# Patient Record
Sex: Female | Born: 1960 | Race: White | Hispanic: No | Marital: Married | Smoking: Never smoker
Health system: Southern US, Community
[De-identification: ages and names within clinical notes are randomized; demographics above are authoritative.]

## PROBLEM LIST (undated history)

## (undated) DIAGNOSIS — E079 Disorder of thyroid, unspecified: Secondary | ICD-10-CM

## (undated) DIAGNOSIS — L709 Acne, unspecified: Secondary | ICD-10-CM

## (undated) DIAGNOSIS — F419 Anxiety disorder, unspecified: Secondary | ICD-10-CM

## (undated) HISTORY — DX: Anxiety disorder, unspecified: F41.9

## (undated) HISTORY — DX: Acne, unspecified: L70.9

## (undated) HISTORY — PX: TUBAL LIGATION: SHX77

---

## 1998-09-19 ENCOUNTER — Other Ambulatory Visit: Admission: RE | Admit: 1998-09-19 | Discharge: 1998-09-19 | Payer: Self-pay | Admitting: Obstetrics and Gynecology

## 1999-11-06 ENCOUNTER — Other Ambulatory Visit: Admission: RE | Admit: 1999-11-06 | Discharge: 1999-11-06 | Payer: Self-pay | Admitting: Obstetrics and Gynecology

## 2000-11-28 ENCOUNTER — Other Ambulatory Visit: Admission: RE | Admit: 2000-11-28 | Discharge: 2000-11-28 | Payer: Self-pay | Admitting: Obstetrics and Gynecology

## 2003-03-09 ENCOUNTER — Other Ambulatory Visit: Admission: RE | Admit: 2003-03-09 | Discharge: 2003-03-09 | Payer: Self-pay | Admitting: Obstetrics and Gynecology

## 2004-03-14 ENCOUNTER — Other Ambulatory Visit: Admission: RE | Admit: 2004-03-14 | Discharge: 2004-03-14 | Payer: Self-pay | Admitting: Obstetrics and Gynecology

## 2004-11-14 ENCOUNTER — Ambulatory Visit: Payer: Self-pay | Admitting: Internal Medicine

## 2004-11-21 ENCOUNTER — Ambulatory Visit: Payer: Self-pay | Admitting: Internal Medicine

## 2005-05-28 ENCOUNTER — Other Ambulatory Visit: Admission: RE | Admit: 2005-05-28 | Discharge: 2005-05-28 | Payer: Self-pay | Admitting: Obstetrics and Gynecology

## 2009-11-08 ENCOUNTER — Encounter (INDEPENDENT_AMBULATORY_CARE_PROVIDER_SITE_OTHER): Payer: Self-pay | Admitting: *Deleted

## 2009-11-17 ENCOUNTER — Encounter (INDEPENDENT_AMBULATORY_CARE_PROVIDER_SITE_OTHER): Payer: Self-pay | Admitting: *Deleted

## 2009-11-27 ENCOUNTER — Encounter: Admission: RE | Admit: 2009-11-27 | Discharge: 2009-11-27 | Payer: Self-pay | Admitting: Obstetrics and Gynecology

## 2009-12-07 ENCOUNTER — Encounter (INDEPENDENT_AMBULATORY_CARE_PROVIDER_SITE_OTHER): Payer: Self-pay | Admitting: *Deleted

## 2009-12-07 ENCOUNTER — Ambulatory Visit: Payer: Self-pay | Admitting: Internal Medicine

## 2010-01-02 ENCOUNTER — Ambulatory Visit: Payer: Self-pay | Admitting: Internal Medicine

## 2010-01-04 ENCOUNTER — Encounter: Payer: Self-pay | Admitting: Internal Medicine

## 2010-06-21 ENCOUNTER — Encounter: Admission: RE | Admit: 2010-06-21 | Discharge: 2010-06-21 | Payer: Self-pay | Admitting: Obstetrics and Gynecology

## 2010-10-16 NOTE — Letter (Signed)
Summary: Previsit letter  [facility]  [address] [place], [place] [address]   Phone: [phone]  Fax: [phone]       11/17/2009 MRN: [id number]  [facility] [address] [place], [place]  [address]  Dear Jodi Mahoney,  Welcome to the Gastroenterology Division at [facility].    You are scheduled to see a nurse for your pre-procedure visit on 12-07-09 at 1pm on the 3rd floor at [facility], [address]  We ask that you try to arrive at our office 15 minutes prior to your appointment time to allow for check-in.  Your nurse visit will consist of discussing your medical and surgical history, your immediate family medical history, and your medications.    Please bring a complete list of all your medications or, if you prefer, bring the medication bottles and we will list them.  We will need to be aware of both prescribed and over the counter drugs.  We will need to know exact dosage information as well.  If you are on blood thinners (Coumadin, Plavix, Aggrenox, Ticlid, etc.) please call our office today/prior to your appointment, as we need to consult with your physician about holding your medication.   Please be prepared to read and sign documents such as consent forms, a financial agreement, and acknowledgement forms.  If necessary, and with your consent, a friend or relative is welcome to sit-in on the nurse visit with you.  Please bring your insurance card so that we may make a copy of it.  If your insurance requires a referral to see a specialist, please bring your referral form from your primary care physician.  No co-pay is required for this nurse visit.     If you cannot keep your appointment, please call [phone] to cancel or reschedule prior to your appointment date.  This allows [place] the opportunity to schedule an appointment for another patient in need of care.    Thank you for choosing [facility] for your medical  needs.  We appreciate the opportunity to care for you.  Please visit [place] at our website  to learn more about our practice.                     Sincerely.                                                                                                                   The Gastroenterology Division

## 2010-10-16 NOTE — Letter (Signed)
Summary: Colonoscopy Letter  [facility]  [address] [place], [place] [address]   Phone: [phone]  Fax: [phone]      November 08, 2009 MRN: [id number]   [facility] [address] [place], [place]  [address]   Dear Ms. Rea,   According to your medical record, it is time for you to schedule a Colonoscopy. The American Cancer Society recommends this procedure as a method to detect early colon cancer. Patients with a family history of colon cancer, or a personal history of colon polyps or inflammatory bowel disease are at increased risk.  This letter has beeen generated based on the recommendations made at the time of your procedure. If you feel that in your particular situation this may no longer apply, please contact our office.  Please call our office at [phone] to schedule this appointment or to update your records at your earliest convenience.  Thank you for cooperating with [place] to provide you with the very best care possible.   Sincerely,  Hedwig Morton. Juanda Chance, M.D.  [facility] Gastroenterology Division [phone]

## 2010-10-16 NOTE — Letter (Signed)
Summary: [facility] Instructions  [facility]  [address] [place], [place] [address]   Phone: [phone]  Fax: [phone]       Jodi Mahoney    [birth year]    MRN: [id number]        Procedure Day /Date:01/02/10  Tuesday     Arrival Time:  7:30am      Procedure Time: 8:30am     Location of Procedure:                    _x _  [facility] (4th Floor)  PREPARATION FOR COLONOSCOPY WITH MOVIPREP   Starting 5 days prior to your procedure _4/14/11 _ do not eat nuts, seeds, popcorn, corn, beans, peas,  salads, or any raw vegetables.  Do not take any fiber supplements (e.g. Metamucil, Citrucel, and Benefiber).  THE DAY BEFORE YOUR PROCEDURE         DATE:  12/27/09  DAY:  Monday  1.  Drink clear liquids the entire day-NO SOLID FOOD  2.  Do not drink anything colored red or purple.  Avoid juices with pulp.  No orange juice.  3.  Drink at least 64 oz. (8 glasses) of fluid/clear liquids during the day to prevent dehydration and help the prep work efficiently.  CLEAR LIQUIDS INCLUDE: Water Jello Ice Popsicles Tea (sugar ok, no milk/cream) Powdered fruit flavored drinks Coffee (sugar ok, no milk/cream) Gatorade Juice: apple, white grape, white cranberry  Lemonade Clear bullion, consomm, broth Carbonated beverages (any kind) Strained chicken noodle soup Hard Candy                             4.  In the morning, mix first dose of MoviPrep solution:    Empty 1 Pouch A and 1 Pouch B into the disposable container    Add lukewarm drinking water to the top line of the container. Mix to dissolve    Refrigerate (mixed solution should be used within 24 hrs)  5.  Begin drinking the prep at 5:00 p.m. The MoviPrep container is divided by 4 marks.   Every 15 minutes drink the solution down to the next mark (approximately 8 oz) until the full liter is complete.   6.  Follow completed prep with 16 oz of clear liquid of your choice (Nothing red or purple).   Continue to drink clear liquids until bedtime.  7.  Before going to bed, mix second dose of MoviPrep solution:    Empty 1 Pouch A and 1 Pouch B into the disposable container    Add lukewarm drinking water to the top line of the container. Mix to dissolve    Refrigerate  THE DAY OF YOUR PROCEDURE      DATE:   01/02/10 DAY:  Tuesday  Beginning at3:30am (5 hours before procedure):         1. Every 15 minutes, drink the solution down to the next mark (approx 8 oz) until the full liter is complete.  2. Follow completed prep with 16 oz. of clear liquid of your choice.    3. You may drink clear liquids until 6:30am   (2 HOURS BEFORE PROCEDURE).   MEDICATION INSTRUCTIONS  Unless otherwise instructed, you should take regular prescription medications with a small sip of water   as early as possible the morning of your procedure.           OTHER INSTRUCTIONS  You will  need a responsible adult at least 49 years of age to accompany you and drive you home.   This person must remain in the waiting room during your procedure.  Wear loose fitting clothing that is easily removed.  Leave jewelry and other valuables at home.  However, you may wish to bring a book to read or  an iPod/MP3 player to listen to music as you wait for your procedure to start.  Remove all body piercing jewelry and leave at home.  Total time from sign-in until discharge is approximately 2-3 hours.  You should go home directly after your procedure and rest.  You can resume normal activities the  day after your procedure.  The day of your procedure you should not:   Drive   Make legal decisions   Operate machinery   Drink alcohol   Return to work  You will receive specific instructions about eating, activities and medications before you leave.    The above instructions have been reviewed and explained to me by   Clide Cliff, RN_______________________    I fully understand and can verbalize  these instructions _____________________________ Date _________

## 2010-10-16 NOTE — Procedures (Signed)
Summary: Colonoscopy  Patient: Steve Gregg Note: All result statuses are Final unless otherwise noted.  Tests: (1) Colonoscopy (COL)   COL Colonoscopy           DONE     [facility]     [address]     [place], [place]  [address]           COLONOSCOPY PROCEDURE REPORT           PATIENT:  Jaela, Yepez  MR#:  [id number]     BIRTHDATE:  November 14, 1960, 48 yrs. old  GENDER:  female     ENDOSCOPIST:  Hedwig Morton. Juanda Chance, MD     REF. BY:  Creola Corn, M.D.     PROCEDURE DATE:  01/02/2010     PROCEDURE:  Colonoscopy 76[address]     ASA CLASS:  Class I     INDICATIONS:  father with colon cancer     hyperplastic polyp 11/2004     MEDICATIONS:   Versed 9 mg, Fentanyl 75 mcg           DESCRIPTION OF PROCEDURE:   After the risks benefits and     alternatives of the procedure were thoroughly explained, informed     consent was obtained.  Digital rectal exam was performed and     revealed no rectal masses.   The LB CF-[id number] endoscope     was introduced through the anus and advanced to the cecum, which     was identified by both the appendix and ileocecal valve, without     limitations.  The quality of the prep was good, using MiraLax.     The instrument was then slowly withdrawn as the colon was fully     examined.     <<PROCEDUREIMAGES>>           FINDINGS:  ULTRASONIC FINDINGS:  Two polyps were found (see image1     and image7).  mm polyp at 90 cm and 2 mm polyp at 20 cm, both     diminutive  Mild diverticulosis was found throughout the colon     (see image6 and image3).  This was otherwise a normal examination     of the colon (see image2, image4, image5, and image8).     Retroflexed views in the rectum revealed no abnormalities.    The     scope was then withdrawn from the patient and the procedure     completed.           COMPLICATIONS:  None     ENDOSCOPIC IMPRESSION:     1) Two polyps     2) Mild diverticulosis throughout the colon     3) Otherwise normal  examination     RECOMMENDATIONS:     1) Await pathology results     2) high fiber diet     REPEAT EXAM:  In 5 year(s) for.           ______________________________     Hedwig Morton. Juanda Chance, MD           CC:           n.     eSIGNED:   Hedwig Morton. Bradd Merlos at 01/02/2010 09:19 AM           Haydee Monica, [id number]  Note: An exclamation mark (!) indicates a result that was not dispersed into the flowsheet. Document Creation Date: 01/02/2010 9:20 AM _______________________________________________________________________  (1) Order result status: Final Collection  or observation date-time: 01/02/2010 09:09 Requested date-time:  Receipt date-time:  Reported date-time:  Referring Physician:   Ordering Physician: Lina Sar [phone]) Specimen Source:  Source: Launa Grill Order Number: [id number] Lab site:   Appended Document: Colonoscopy     Procedures Next Due Date:    Colonoscopy: 01/2015

## 2010-10-16 NOTE — Letter (Signed)
Summary: Patient Notice- Polyp Results  [facility]  [address] [place], [place] [address]   Phone: [phone]  Fax: [phone]        January 04, 2010 MRN: [id number]    Jodi Mahoney [address] [place], [place]  [address]    Dear Ms. Hallmark,  I am pleased to inform you that the colon polyp(s) removed during your recent colonoscopy was (were) found to be benign (no cancer detected) upon pathologic examination.The tissue from the polyps consisted of lymphoid glands, no real polyps  I recommend you have a repeat colonoscopy examination in 5_ years or ,  Should you develop new or worsening symptoms of abdominal pain, bowel habit changes or bleeding from the rectum or bowels, please schedule an evaluation with either your primary care physician or with me.  Additional information/recommendations:  _5_ No further action with gastroenterology is needed at this time. Please      follow-up with your primary care physician for your other healthcare      needs.  __ Please call [phone] to schedule a return visit to review your      situation.  __ Please keep your follow-up visit as already scheduled.  __ Continue treatment plan as outlined the day of your exam.  Please call us if you are having persistent problems or have questions about your condition that have not been fully answered at this time.  Sincerely,  Hart Carwin MD  This letter has been electronically signed by your physician.  Appended Document: Patient Notice- Polyp Results letter mailed 4.25.11

## 2010-10-16 NOTE — Miscellaneous (Signed)
Summary: RECALL COLON/YF  Clinical Lists Changes  Medications: Added new medication of MOVIPREP 100 GM  SOLR (PEG-KCL-NACL-NASULF-NA ASC-C) As directed - Signed Rx of MOVIPREP 100 GM  SOLR (PEG-KCL-NACL-NASULF-NA ASC-C) As directed;  #1 x 0;  Signed;  Entered by: Clide Cliff RN;  Authorized by: Hart Carwin MD;  Method used: Electronically to [id number] [address] [place], [place], [place]  [address], Ph: [phone], Fax: [phone] Observations: Added new observation of ALLERGY REV: Done (12/07/2009 12:48)    Prescriptions: MOVIPREP 100 GM  SOLR (PEG-KCL-NACL-NASULF-NA ASC-C) As directed  #1 x 0   Entered by:   Clide Cliff RN   Authorized by:   Hart Carwin MD   Signed by:   Clide Cliff RN on 12/07/2009   Method used:   Electronically to        [id number]* (retail)       [address]       [place], [place]  [address]       Ph: [phone]       Fax: [phone]   RxID:   [id number]

## 2011-10-07 ENCOUNTER — Emergency Department (INDEPENDENT_AMBULATORY_CARE_PROVIDER_SITE_OTHER): Payer: PRIVATE HEALTH INSURANCE

## 2011-10-07 ENCOUNTER — Emergency Department (HOSPITAL_COMMUNITY)
Admission: EM | Admit: 2011-10-07 | Discharge: 2011-10-07 | Disposition: A | Discharge status: Home / Self Care | Payer: PRIVATE HEALTH INSURANCE | Source: Home / Self Care | Attending: Family Medicine | Admitting: Family Medicine

## 2011-10-07 ENCOUNTER — Encounter (HOSPITAL_COMMUNITY): Payer: Self-pay

## 2011-10-07 DIAGNOSIS — S300XXA Contusion of lower back and pelvis, initial encounter: Secondary | ICD-10-CM

## 2011-10-07 DIAGNOSIS — S5000XA Contusion of unspecified elbow, initial encounter: Secondary | ICD-10-CM

## 2011-10-07 DIAGNOSIS — S5001XA Contusion of right elbow, initial encounter: Secondary | ICD-10-CM

## 2011-10-07 HISTORY — DX: Disorder of thyroid, unspecified: E07.9

## 2011-10-07 MED ORDER — IBUPROFEN 800 MG PO TABS: 800.0000 mg | ORAL_TABLET | Freq: Three times a day (TID) | ORAL | Status: AC | PRN

## 2011-10-07 MED ORDER — CYCLOBENZAPRINE HCL 10 MG PO TABS: 10.0000 mg | ORAL_TABLET | Freq: Two times a day (BID) | ORAL | Status: AC | PRN

## 2011-10-07 NOTE — ED Notes (Signed)
Pt states she slipped on wet floor in restaurant earlier today and fell, landed on buttocks.  Denies head trauma or LOC.   C/o intermittent tingling in shoulders- states this has almost subsided.  Also c/o pain in buttocks and bruising, and pain to rt elbow area.

## 2011-10-07 NOTE — ED Provider Notes (Signed)
History     CSN: [id number]  Arrival date & time 10/07/11  1441   First MD Initiated Contact with Patient 10/07/11 1610      Chief Complaint  Patient presents with  . Fall    (Consider location/radiation/quality/duration/timing/severity/associated sxs/prior treatment) HPI Comments: 51 y/o female no significant PMH here c/o pain and bruising in her buttocks and right elbow after falling earlier today at a restaurant. State the floor was wet and she slipped falling backwards landing on her buttock and right elbow. Also had associated tingling sensation in neck and shoulder but that has resolved. Denies hitting hear head. No skin lacerations.    Past Medical History  Diagnosis Date  . Thyroid disease     Past Surgical History  Procedure Date  . Tubal ligation     No family history on file.  History  Substance Use Topics  . Smoking status: Never Smoker   . Smokeless tobacco: Not on file  . Alcohol Use: Yes    OB History    Grav Para Term Preterm Abortions TAB SAB Ect Mult Living                  Review of Systems  HENT: Negative for neck pain and neck stiffness.   Respiratory: Negative for cough and shortness of breath.   Cardiovascular: Negative for chest pain and leg swelling.  Gastrointestinal: Negative for abdominal pain.  Genitourinary: Negative for hematuria.  Musculoskeletal:       Buttocks and elbow pain as per HPI  Skin: Negative for wound.  Neurological: Positive for headaches. Negative for dizziness and numbness.    Allergies  Review of patient's allergies indicates no known allergies.  Home Medications   Current Outpatient Rx  Name Route Sig Dispense Refill  . LEVOTHYROXINE SODIUM 100 MCG PO TABS Oral Take 100 mcg by mouth daily.    . CYCLOBENZAPRINE HCL 10 MG PO TABS Oral Take 1 tablet (10 mg total) by mouth 2 (two) times daily as needed for muscle spasms. 20 tablet 0  . IBUPROFEN 800 MG PO TABS Oral Take 1 tablet (800 mg total) by mouth every  8 (eight) hours as needed for pain. 20 tablet 0    Take with food    BP 150/86  Pulse 84  Temp(Src) 98.1 F (36.7 C) (Oral)  Resp 18  SpO2 100%  LMP 08/28/2011  Physical Exam  Nursing note and vitals reviewed. Constitutional: She is oriented to person, place, and time. She appears well-developed and well-nourished. No distress.  HENT:  Head: Normocephalic and atraumatic.  Neck: Normal range of motion. Neck supple.       No pain over cervical spinal processes. Spurling test negative. Normal strength, sensitivity and DTR's of the upper extremities.    Cardiovascular: Normal heart sounds.   Pulmonary/Chest: Breath sounds normal.  Musculoskeletal:       There is mild contusion and skin abrasion with focal tenderness but, no hematoma medial and internal to right elbow. Right elbow join has FROM. No effusion.  Right buttock mild to moderate contusion and abrasion of the skin area tender to palpation. Normal hip exam bilateral. Normal gate. Able to sit with some reported discomfort . No TTP in low lumbar area or sacrum.   Neurological: She is alert and oriented to person, place, and time.    ED Course  Procedures (including critical care time)  Labs Reviewed - No data to display Dg Sacrum/coccyx  10/07/2011  *RADIOLOGY REPORT*  Clinical Data:  Fall, tail bone pain  SACRUM AND COCCYX - 2+ VIEW  Comparison: None.  Findings: Symmetric SI joints.  No diastasis at the symphysis. Sacral ala appear symmetric and intact.  No definite displaced fracture or malalignment on the lateral view.  IMPRESSION: No definite acute osseous finding.  Original Report Authenticated By: Judie Petit. Ruel Favors, M.D.   Dg Elbow Complete Right  10/07/2011  *RADIOLOGY REPORT*  Clinical Data: Larey Seat on floor with elbow pain  RIGHT ELBOW - COMPLETE 3+ VIEW  Comparison: None.  Findings: No acute fracture is seen.  Alignment is normal.  No elbow joint effusion is noted.  IMPRESSION: Negative.  Original Report Authenticated By:  Juline Patch, M.D.     1. Contusion of buttock   2. Contusion of elbow, right       MDM  Treated symptomatically.         Sharin Grave, MD 10/08/11 1305

## 2012-02-12 ENCOUNTER — Other Ambulatory Visit: Payer: Self-pay | Admitting: Obstetrics and Gynecology

## 2012-07-29 IMAGING — CR DG ELBOW COMPLETE 3+V*R*
4 series · 4 of 4 positions shown · non-contrast
Comparison: None.

CLINICAL DATA: Fell on floor with elbow pain

RIGHT ELBOW - COMPLETE 3+ VIEW

[view not recorded (1 of 4)]
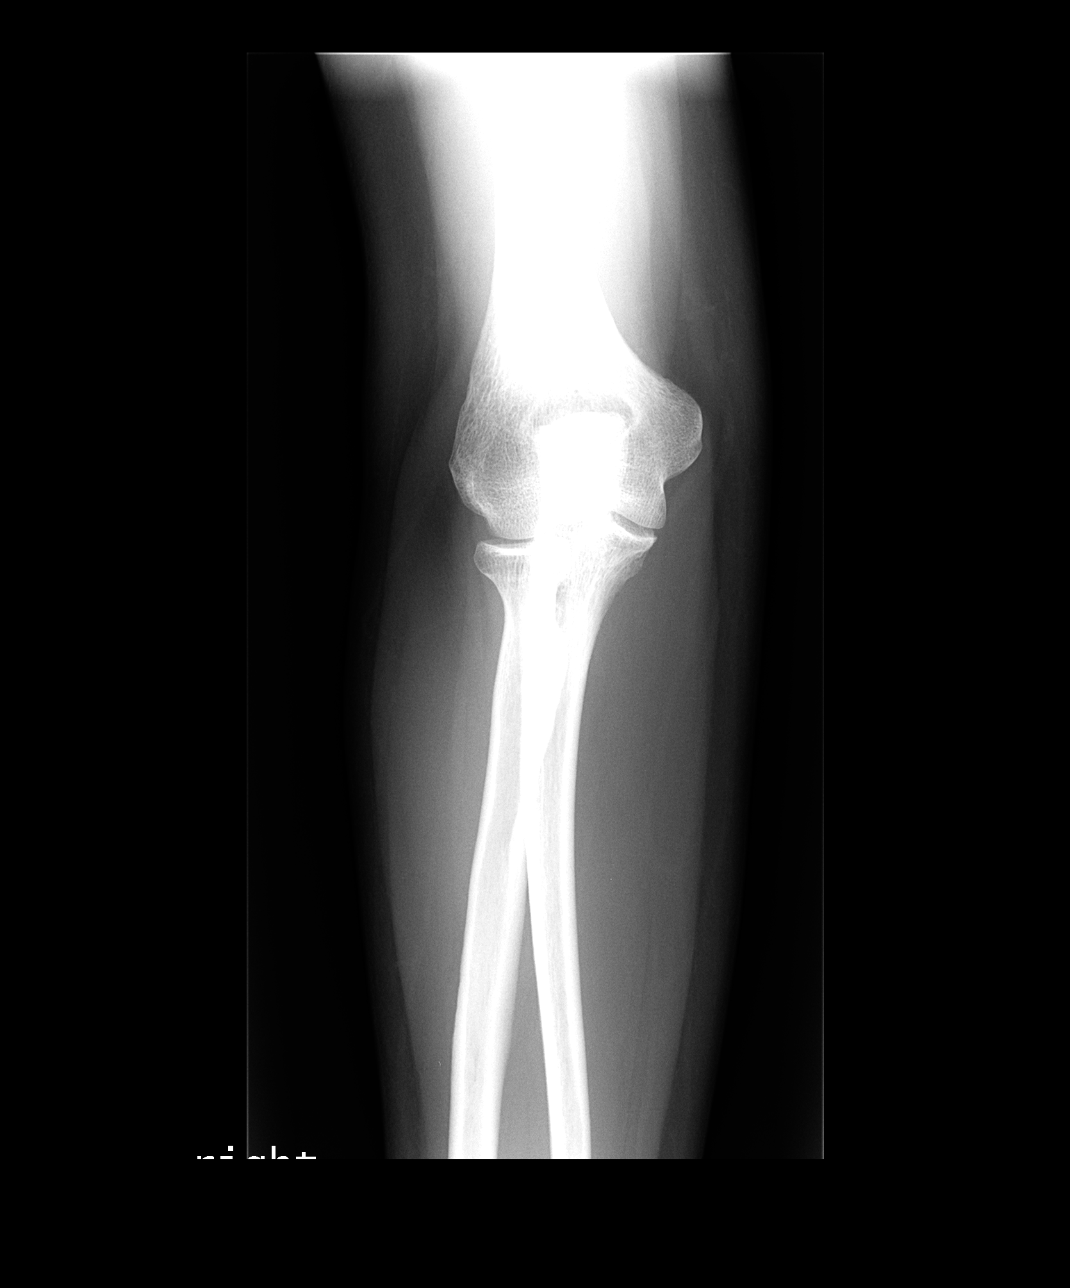

[view not recorded (2 of 4)]
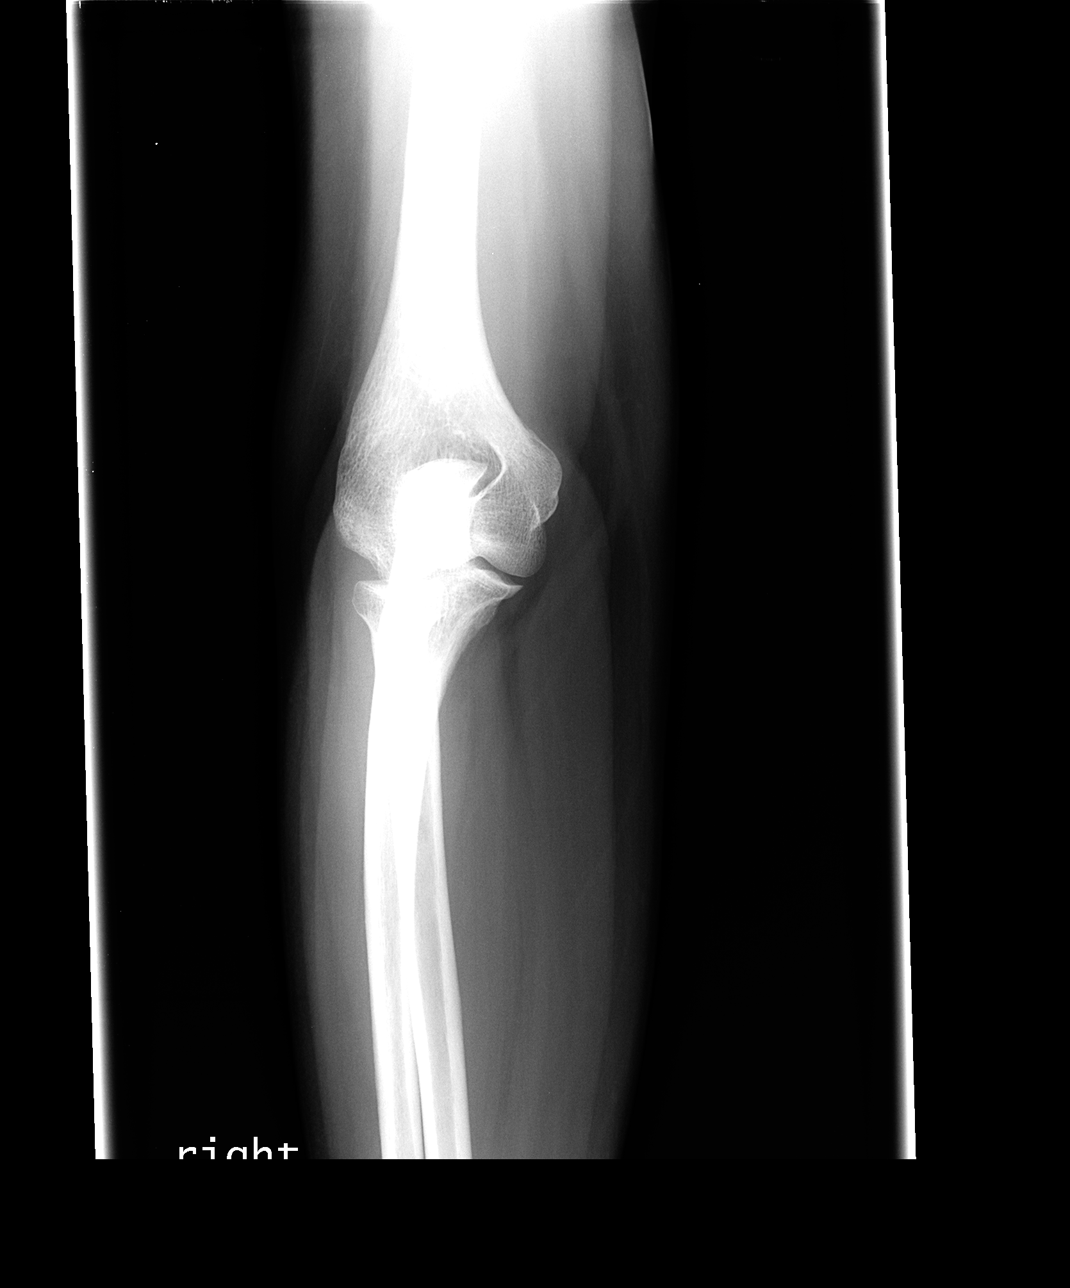

[view not recorded (3 of 4)]
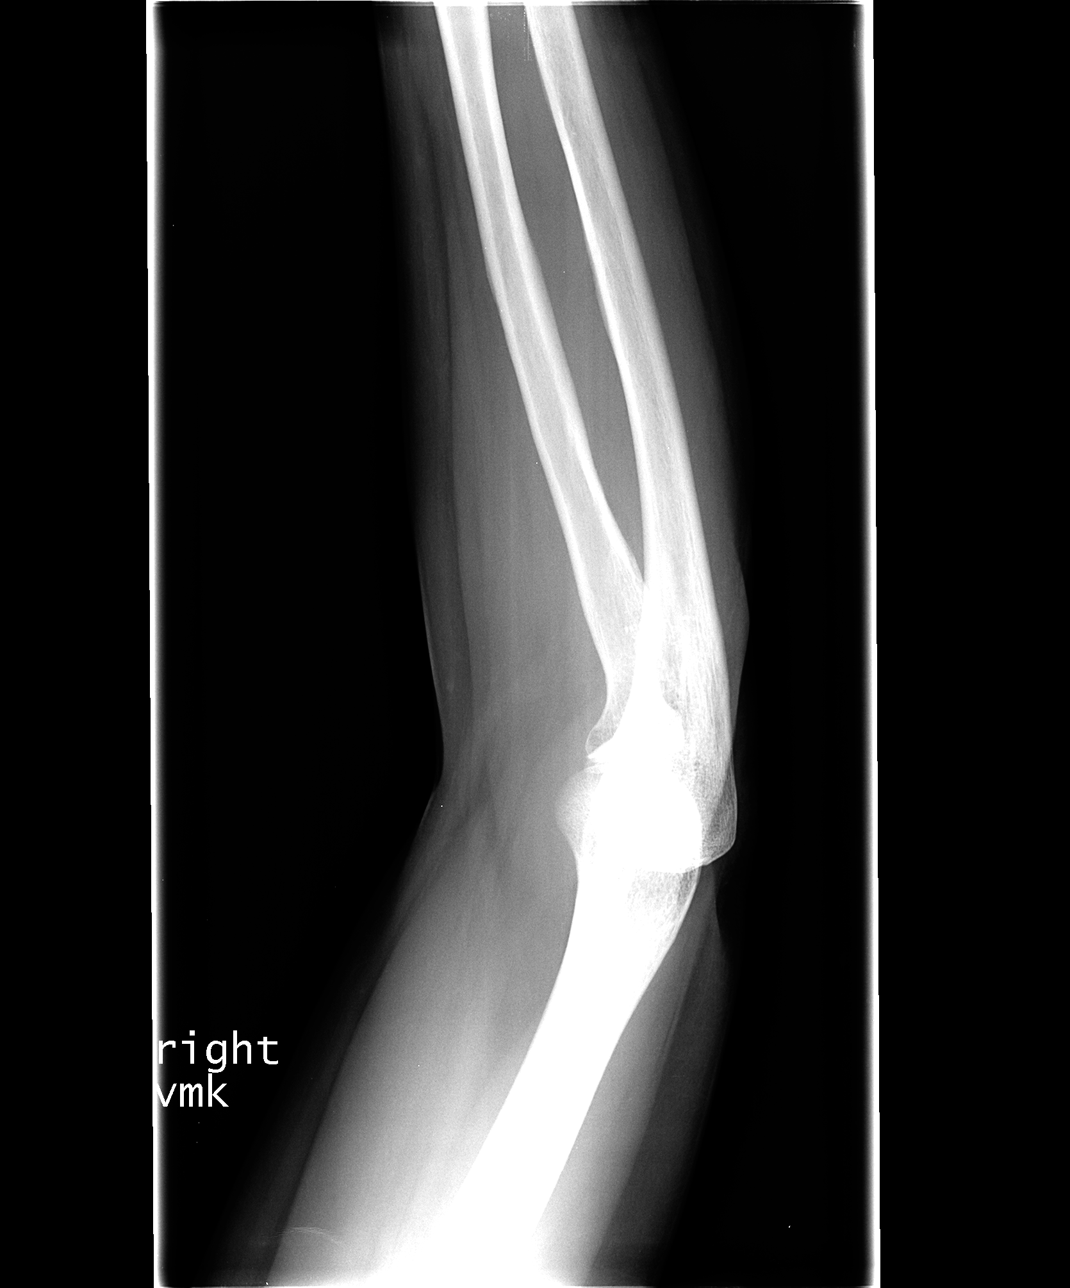

[view not recorded (4 of 4)]
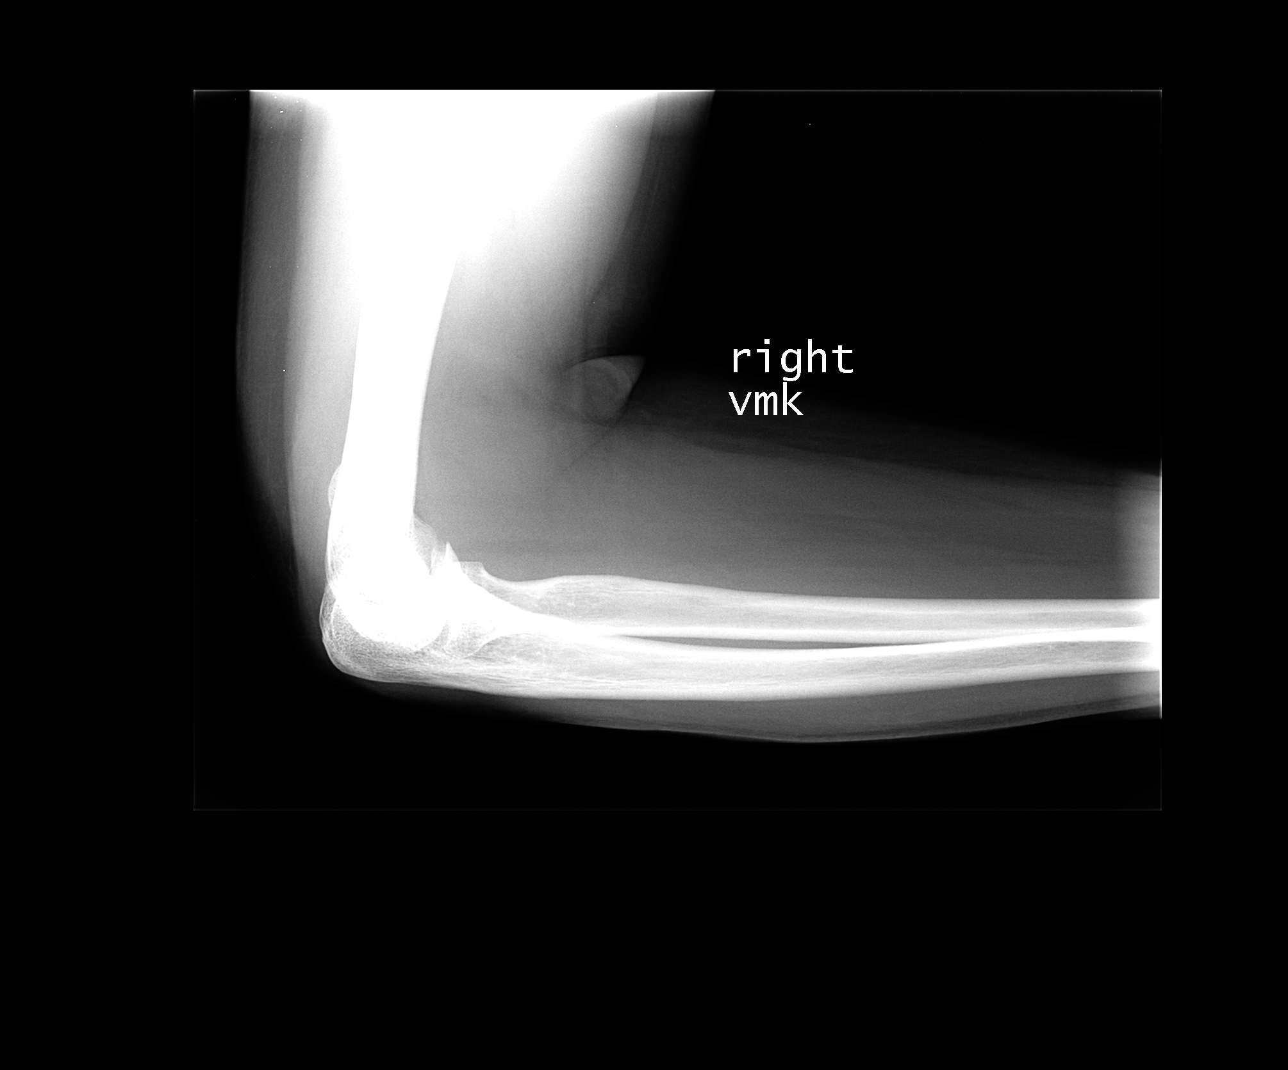

[4 of 4 positions shown; findings below may reference images not displayed]

FINDINGS: No acute fracture is seen.  Alignment is normal.  No
elbow joint effusion is noted.
IMPRESSION: Negative.

## 2013-03-04 ENCOUNTER — Other Ambulatory Visit: Payer: Self-pay | Admitting: Obstetrics and Gynecology

## 2014-03-31 ENCOUNTER — Other Ambulatory Visit: Payer: Self-pay | Admitting: Obstetrics and Gynecology

## 2014-04-01 LAB — CYTOLOGY - PAP

## 2014-05-24 ENCOUNTER — Encounter: Payer: Self-pay | Admitting: Internal Medicine

## 2014-09-16 HISTORY — PX: COLONOSCOPY: SHX174

## 2014-10-17 ENCOUNTER — Encounter: Payer: Self-pay | Admitting: Internal Medicine

## 2014-12-21 ENCOUNTER — Ambulatory Visit (AMBULATORY_SURGERY_CENTER): Payer: Self-pay

## 2014-12-21 VITALS — Ht 65.0 in | Wt 157.8 lb

## 2014-12-21 DIAGNOSIS — Z860100 Personal history of colon polyps, unspecified: Secondary | ICD-10-CM

## 2014-12-21 MED ORDER — MOVIPREP 100 G PO SOLR: 1.0000 | Freq: Once | ORAL | Status: DC

## 2014-12-21 NOTE — Progress Notes (Signed)
No allergies to eggs or  Soy No diet/weight loss meds No home oxygen No past problems with anesthesia  Has email  Emmi instructions given for colonoscopy

## 2015-01-04 ENCOUNTER — Encounter: Payer: Self-pay | Admitting: Internal Medicine

## 2015-01-04 ENCOUNTER — Encounter: Payer: PRIVATE HEALTH INSURANCE | Admitting: Internal Medicine

## 2015-01-11 ENCOUNTER — Ambulatory Visit (AMBULATORY_SURGERY_CENTER): Payer: PRIVATE HEALTH INSURANCE | Admitting: Internal Medicine

## 2015-01-11 ENCOUNTER — Encounter: Payer: Self-pay | Admitting: Internal Medicine

## 2015-01-11 VITALS — BP 98/62 | HR 63 | Temp 98.2°F | Resp 21 | Ht 65.0 in | Wt 157.0 lb

## 2015-01-11 DIAGNOSIS — Z86010 Personal history of colonic polyps: Secondary | ICD-10-CM

## 2015-01-11 DIAGNOSIS — Z1211 Encounter for screening for malignant neoplasm of colon: Secondary | ICD-10-CM

## 2015-01-11 DIAGNOSIS — Z800 Family history of malignant neoplasm of digestive organs: Secondary | ICD-10-CM

## 2015-01-11 MED ORDER — SODIUM CHLORIDE 0.9 % IV SOLN: 500.0000 mL | INTRAVENOUS | Status: DC

## 2015-01-11 NOTE — Op Note (Signed)
[facility] [address] [place], [id number]   COLONOSCOPY PROCEDURE REPORT  PATIENT: Jodi MatteBrowning, [name]  MR#: [id number] BIRTHDATE: June 07, 1961 , 53  yrs. old GENDER: female ENDOSCOPIST: Hart Carwinora M Nandi Tonnesen, MD REFERRED WN:UUVOBY:John Timothy Lassousso, M.D. PROCEDURE DATE:  01/11/2015 PROCEDURE:   Colonoscopy, screening First Screening Colonoscopy - Avg.  risk and is 50 yrs.  old or older - No.  Prior Negative Screening - Now for repeat screening. N/A  History of Adenoma - Now for follow-up colonoscopy & has been > or = to 3 yrs.  N/A ASA CLASS:   Class II INDICATIONS:FH Colon or Rectal Adenocarcinoma and father with colon cancer.  Prior colonoscopy in March 2006 showed hyperplastic polyps.  Last colonoscopy April 2011 small polyp removed. Pathology consisted of polypoid mucosa. MEDICATIONS: Monitored anesthesia care and Propofol 200 mg IV  DESCRIPTION OF PROCEDURE:   After the risks benefits and alternatives of the procedure were thoroughly explained, informed consent was obtained.  The digital rectal exam revealed no abnormalities of the rectum.   The LB PFC-H190 [id number]  endoscope was introduced through the anus and advanced to the cecum, which was identified by both the appendix and ileocecal valve. No adverse events experienced.   The quality of the prep was good.  (MoviPrep was used)  The instrument was then slowly withdrawn as the colon was fully examined.      COLON FINDINGS: There was mild diverticulosis noted in the sigmoid colon.  Retroflexed views revealed no abnormalities. The time to cecum = 5.36 Withdrawal time = 8.06   The scope was withdrawn and the procedure completed. COMPLICATIONS: There were no immediate complications.  ENDOSCOPIC IMPRESSION: Mild diverticulosis was noted in the sigmoid colon  RECOMMENDATIONS: Recall colonoscopy in 5 years High fiber diet  eSigned:  Hart Carwinora M Kendell Gammon, MD 01/11/2015 9:14 AM   cc:

## 2015-01-11 NOTE — Patient Instructions (Signed)
Mild diverticulosis seen today! Try to follow high fiber diet, handouts given on diverticulosis and fiber diet.  Repeat colonoscopy in 5 years. Call us with any [place] or concerns. Thank you!  YOU HAD AN ENDOSCOPIC PROCEDURE TODAY AT THE [facility]:   Refer to the procedure report that was given to you for any specific questions about what was found during the examination.  If the procedure report does not answer your questions, please call your gastroenterologist to clarify.  If you requested that your care partner not be given the details of your procedure findings, then the procedure report has been included in a sealed envelope for you to review at your convenience later.  YOU SHOULD EXPECT: Some feelings of bloating in the abdomen. Passage of more gas than usual.  Walking can help get rid of the air that was put into your GI tract during the procedure and reduce the bloating. If you had a lower endoscopy (such as a colonoscopy or flexible sigmoidoscopy) you may notice spotting of blood in your stool or on the toilet paper. If you underwent a bowel prep for your procedure, you may not have a normal bowel movement for a few days.  Please Note:  You might notice some irritation and congestion in your nose or some drainage.  This is from the oxygen used during your procedure.  There is no need for concern and it should clear up in a day or so.  SYMPTOMS TO REPORT IMMEDIATELY:   Following lower endoscopy (colonoscopy or flexible sigmoidoscopy):  Excessive amounts of blood in the stool  Significant tenderness or worsening of abdominal pains  Swelling of the abdomen that is new, acute  Fever of 100F or higher   Following upper endoscopy (EGD)  Vomiting of blood or coffee ground material  New chest pain or pain under the shoulder blades  Painful or persistently difficult swallowing  New shortness of breath  Fever of 100F or higher  Black, tarry-looking stools  For urgent or  emergent issues, a gastroenterologist can be reached at any hour by calling [phone].   DIET: Your first meal following the procedure should be a small meal and then it is ok to progress to your normal diet. Heavy or fried foods are harder to digest and may make you feel nauseous or bloated.  Likewise, meals heavy in dairy and vegetables can increase bloating.  Drink plenty of fluids but you should avoid alcoholic beverages for 24 hours.  ACTIVITY:  You should plan to take it easy for the rest of today and you should NOT DRIVE or use heavy machinery until tomorrow (because of the sedation medicines used during the test).    FOLLOW UP: Our staff will call the number listed on your records the next business day following your procedure to check on you and address any questions or concerns that you may have regarding the information given to you following your procedure. If we do not reach you, we will leave a message.  However, if you are feeling well and you are not experiencing any problems, there is no need to return our call.  We will assume that you have returned to your regular daily activities without incident.  If any biopsies were taken you will be contacted by phone or by letter within the next 1-3 weeks.  Please call us at [phone] if you have not heard about the biopsies in 3 weeks.    SIGNATURES/CONFIDENTIALITY: You and/or your care  partner have signed paperwork which will be entered into your electronic medical record.  These signatures attest to the fact that that the information above on your After Visit Summary has been reviewed and is understood.  Full responsibility of the confidentiality of this discharge information lies with you and/or your care-partner.

## 2015-01-11 NOTE — Progress Notes (Signed)
  [facility] Anesthesia Post-op Note  Patient: Jodi Mahoney  Procedure(s) Performed: colonoscopy  Patient Location: [facility] - Recovery Area  Anesthesia Type: Deep Sedation/Propofol  Level of Consciousness: awake, oriented and patient cooperative  Airway and Oxygen Therapy: Patient Spontanous Breathing  Post-op Pain: none  Post-op Assessment:  Post-op Vital signs reviewed, Patient's Cardiovascular Status Stable, Respiratory Function Stable, Patent Airway, No signs of Nausea or vomiting and Pain level controlled  Post-op Vital Signs: Reviewed and stable  Complications: No apparent anesthesia complications  Bunyan Brier E 9:12 AM

## 2015-01-12 ENCOUNTER — Telehealth: Payer: Self-pay | Admitting: *Deleted

## 2015-01-12 NOTE — Telephone Encounter (Signed)
  Follow up Call-  Call back number 01/11/2015  Post procedure Call Back phone  # [phone]  Permission to leave phone message Yes     Patient questions:  Do you have a fever, pain , or abdominal swelling? No. Pain Score  0 *  Have you tolerated food without any problems? Yes.    Have you been able to return to your normal activities? Yes.    Do you have any questions about your discharge instructions: Diet   No. Medications  No. Follow up visit  No.  Do you have questions or concerns about your Care? No.  Actions: * If pain score is 4 or above: No action needed, pain <4.

## 2015-04-25 ENCOUNTER — Other Ambulatory Visit: Payer: Self-pay | Admitting: Obstetrics and Gynecology

## 2015-04-26 LAB — CYTOLOGY - PAP

## 2016-06-28 ENCOUNTER — Ambulatory Visit: Payer: 59 | Attending: Obstetrics and Gynecology | Admitting: Physical Therapy

## 2016-06-28 ENCOUNTER — Encounter: Payer: Self-pay | Admitting: Physical Therapy

## 2016-06-28 DIAGNOSIS — M62838 Other muscle spasm: Secondary | ICD-10-CM | POA: Insufficient documentation

## 2016-06-28 NOTE — Patient Instructions (Addendum)
About Abdominal Massage  Abdominal massage, also called external colon massage, is a self-treatment circular massage technique that can reduce and eliminate gas and ease constipation. The colon naturally contracts in waves in a clockwise direction starting from inside the right hip, moving up toward the ribs, across the belly, and down inside the left hip.  When you perform circular abdominal massage, you help stimulate your colon's normal wave pattern of movement called peristalsis.  It is most beneficial when done after eating.  Positioning You can practice abdominal massage with oil while lying down, or in the shower with soap.  Some people find that it is just as effective to do the massage through clothing while sitting or standing.  How to Massage Start by placing your finger tips or knuckles on your right side, just inside your hip bone.  . Make small circular movements while you move upward toward your rib cage.   . Once you reach the bottom right side of your rib cage, take your circular movements across to the left side of the bottom of your rib cage.  . Next, move downward until you reach the inside of your left hip bone.  This is the path your feces travel in your colon. . Continue to perform your abdominal massage in this pattern for 10 minutes each day.     You can apply as much pressure as is comfortable in your massage.  Start gently and build pressure as you continue to practice.  Notice any areas of pain as you massage; areas of slight pain may be relieved as you massage, but if you have areas of significant or intense pain, consult with your healthcare provider.  Other Considerations . General physical activity including bending and stretching can have a beneficial massage-like effect on the colon.  Deep breathing can also stimulate the colon because breathing deeply activates the same nervous system that supplies the colon.   . Abdominal massage should always be used in  combination with a bowel-conscious diet that is high in the proper type of fiber for you, fluids (primarily water), and a regular exercise program.    You tube  Burnett HarryShelly Prosko PT [employer] Guided pelvic floor meditation by [employer] do this video daily  Sex Without Pain: A Self-Treatment Guide To The Sex Life You Deserve Saverio DankerHeather Jeffcoat DPT  Buy at [email or web address]   Set of dilators by [email or web address].com   Sitting    Sit comfortably. Allow body's muscles to relax. Place hands on belly. Inhale slowly and deeply for 3___ seconds, so hands move out. Then take _3__ seconds to exhale. Repeat _10__ times. Do __3_ times a day. Or when feeling tense  Copyright  VHI. All rights reserved.  Bear Down    Exhaling, bear down as if to have a bowel movement. Repeat _5__ times. Do _1__ times a day.  Copyright  VHI. All rights reserved.  [facility] [address], [address] [place], [place] [address] Phone # [phone] Fax [phone]

## 2016-06-28 NOTE — Therapy (Signed)
[facility] [address], [address] [place], [place], [address] Phone: [phone]   Fax:  [phone]  Physical Therapy Evaluation  Patient Details  Name: SHA BURLING MRN: [id number] Date of Birth: 20-Mar-1961 Referring Provider: Dr. Marcelle Overlie  Encounter Date: 06/28/2016      PT End of Session - 06/28/16 1113    Visit Number 1   Date for PT Re-Evaluation 10/29/16   PT Start Time 1015   PT Stop Time 1100   PT Time Calculation (min) 45 min   Activity Tolerance Patient tolerated treatment well   Behavior During Therapy [facility] for tasks assessed/performed      Past Medical History:  Diagnosis Date  . Thyroid disease     Past Surgical History:  Procedure Laterality Date  . TUBAL LIGATION      There were no vitals filed for this visit.       Subjective Assessment - 06/28/16 1022    Subjective No urinary leakage. Patient reports pain for the past 2-3 years.  Patient is having pain with intercourse. Patient reports she has had the [facility] procedure. Moisture is not the issue.  Unable to have intercourse due to pain.  During the vaginal exam, she had increased pain with instrument.    Patient Stated Goals reduce pain with intercourse   Currently in Pain? Yes   Pain Score 10-Worst pain ever   Pain Location Vagina   Pain Orientation Mid   Pain Descriptors / Indicators Shooting   Pain Type Acute pain   Pain Onset More than a month ago   Pain Frequency Intermittent   Aggravating Factors  intercourse; vaginal exam   Pain Relieving Factors no intercourse   Multiple Pain Sites No            [facility] PT Assessment - 06/28/16 0001      Assessment   Medical Diagnosis Vaginismus   Referring Provider Dr. Marcelle Overlie   Onset Date/Surgical Date 06/28/14   Prior Therapy None     Precautions   Precautions None     Restrictions   Weight Bearing Restrictions No     Balance Screen   Has the patient  fallen in the past 6 months No   Has the patient had a decrease in activity level because of a fear of falling?  No   Is the patient reluctant to leave their home because of a fear of falling?  No     Home [employer] information [employer] manager residence     Prior Function   Level of Independence Independent     Cognition   Overall Cognitive Status Within Functional Limits for tasks assessed     Observation/Other Assessments   Focus on Therapeutic Outcomes (FOTO)  17% limitation     Posture/Postural Control   Posture/Postural Control No significant limitations     ROM / Strength   AROM / PROM / Strength AROM     AROM   Overall AROM Comments lumbar flexion decreased by 25% with tightness     Palpation   Spinal mobility L1-L5 decreased P-A mobility   SI assessment  left ilium is posteriorly rotated                 Pelvic Floor Special Questions - 06/28/16 0001    Urinary Leakage No   Skin Integrity Erthema  posterior intoitus   Pelvic Floor Internal Exam Patient confirms identification and approves therapist to assess pelvic muscle  integrity   Exam Type Vaginal   Palpation bilateral obturator internist, posterior introitus   Tone high tone of pelvic floor muscles                  PT Education - 06/28/16 1112    Education provided Yes   Education Details abdominal massage; you tube video to relax pelvic floor; vaginal dilators, diaphragmatic breathing, bearing down to stretch pelvic floor   Person(s) Educated Patient   Methods Explanation;Demonstration;Verbal cues;Handout   Comprehension Returned demonstration;Verbalized understanding          PT Short Term Goals - 06/28/16 1124      PT SHORT TERM GOAL #1   Title independent with flexibility exericses   Time 4   Period Weeks   Status New     PT SHORT TERM GOAL #2   Title purchased vaginal dilatores   Time 4   Period Weeks   Status New     PT SHORT TERM GOAL #3   Title understand  how to perform self perineal massage   Time 4   Period Weeks   Status New     PT SHORT TERM GOAL #4   Title understand how to relax the pelvic floor during foreplay with her spouse   Time 4   Period Weeks   Status New           PT Long Term Goals - 06/28/16 1114      PT LONG TERM GOAL #1   Title independent with HEP   Time 4   Period Months   Status New     PT LONG TERM GOAL #2   Title ability to have penile pentration due to able to relax the pelvic floor muscles and pain decreased >/= 75%   Time 4   Period Weeks   Status New     PT LONG TERM GOAL #3   Title doing pelvic floor mediation exercise daily to keep muscles relaxed   Time 4   Period Months   Status New     PT LONG TERM GOAL #4   Title During pelvic floor soft tissue work no palpable pain due to decreased tone of pelvic floor.    Time 4   Period Months   Status New     PT LONG TERM GOAL #5   Title independent with use of pelvic floor dilators to keep the vaginal introitus expanded   Time 4   Period Months   Status New               Plan - 06/28/16 1133    Clinical Impression Statement Patient is a 55 year old female with pain with intercourse for the past 2 years with sudden onset.  Patient has had the [facility] StagerMona Lisa procedure to work on vaginal dryness.  Patient reports she has stopped having intercourse with her husband due to pain level 10/10,marinoff scale is 3/3.  Patient reports she is very tense in the vaginal area when she thinks or talks about intercourse. She feels she is not able to relax the muscles.  During palpation increased tenderness located in the posterior introitus, bil. obturator internist, and increased tension in the pelvic floor muscles.  The introitus is decreased in size. Pelvic floor strength is 5/5.  Patient is of low complexi evaluation due to stable condition and no comorbidities that will impact care provided. Patient will benefit form skilled therapy to reduce pelvic floor  muscle tension and reduce  pain for penile penetration.    Rehab Potential Excellent   Clinical Impairments Affecting Rehab Potential None   PT Frequency 1x / week   PT Duration Other (comment)  4 months   PT Treatment/Interventions Biofeedback;Moist Heat;Ultrasound;Patient/family education;Neuromuscular re-education;Therapeutic exercise;Therapeutic activities;Manual techniques   PT Next Visit Plan soft tissue work, hip stretches, toileting technique   PT Home Exercise Plan flexibility exercises    Recommended Other Services none   Consulted and Agree with Plan of Care Patient      Patient will benefit from skilled therapeutic intervention in order to improve the following deficits and impairments:  Pain, Increased muscle spasms, Decreased endurance  Visit Diagnosis: Other muscle spasm - Plan: PT plan of care cert/re-cert     Problem List There are no active problems to display for this patient.   Eulis Foster, PT 06/28/16 11:45 AM   [facility] [address] [place], [place], [address] Phone: [phone]   Fax:  [phone]  Name: ADMIRE BUNNELL MRN: [id number] Date of Birth: [birth year]

## 2016-07-02 ENCOUNTER — Ambulatory Visit: Payer: 59 | Admitting: Physical Therapy

## 2016-07-02 ENCOUNTER — Encounter: Payer: Self-pay | Admitting: Physical Therapy

## 2016-07-02 DIAGNOSIS — M62838 Other muscle spasm: Secondary | ICD-10-CM | POA: Diagnosis not present

## 2016-07-02 NOTE — Patient Instructions (Addendum)
Chair Sitting    Sit at edge of seat, spine straight, one leg extended. Put a hand on each thigh and bend forward from the hip, keeping spine straight. Allow hand on extended leg to reach toward toes. Support upper body with other arm. Hold _30__ seconds. Repeat _2__ times per session. Do __1_ sessions per day.  Copyright  VHI. All rights reserved.  Piriformis Stretch, Sitting    Sit, one ankle on opposite knee, same-side hand on crossed knee. Push down on knee, keeping spine straight. Lean torso forward, with flat back, until tension is felt in hamstrings and gluteals of crossed-leg side. Hold _30__ seconds.  Repeat _2__ times per session. Do _1__ sessions per day.  Copyright  VHI. All rights reserved.  Quads / HF, Standing    Stand, holding onto chair and grasping one foot with same-side hand. Pull heel toward buttock. Hold _30__ seconds.  Repeat _2__ times per session. Do _1__ sessions per day.  Copyright  VHI. All rights reserved.    1. Position yourself as shown, grabbing onto the feet or behind the knees; you should feel a gentle stretch.  2. Breathe in and allow the pelvic floor muscles to relax.  3. Hold this position for 2-3 minutes.    While in a crawl position, slowly lower your buttocks towards your feet until a stretch is felt along your back and or buttocks. Hold for 30-60 seconds. 2 times 1 time per day.  [facility] [address], [address] [place], [place] [address] Phone # [phone] Fax [phone]

## 2016-07-02 NOTE — Therapy (Signed)
[facility] [address] [place], [place], [address] Phone: [phone]   Fax:  [phone]  Physical Therapy Treatment  Patient Details  Name: Jodi Mahoney MRN: [id number] Date of Birth: [birth year] Referring Provider: Dr. Marcelle OverlieMichelle Grewal  Encounter Date: 07/02/2016      PT End of Session - 07/02/16 0855    Visit Number 2   Date for PT Re-Evaluation 10/29/16   PT Start Time 0808   PT Stop Time 0850   PT Time Calculation (min) 42 min   Activity Tolerance Patient tolerated treatment well   Behavior [facility] Therapy [facility] for tasks assessed/performed      Past Medical History:  Diagnosis Date  . Thyroid disease     Past Surgical History:  Procedure Laterality Date  . TUBAL LIGATION      There were no vitals filed for this visit.      Subjective Assessment - 07/02/16 0811    Subjective I bought the dilator set.  I am seeing where I have tension in my body and how to relax it. I am at the peak of my stress season.    Patient Stated Goals reduce pain with intercourse   Currently in Pain? Yes   Pain Score 10-Worst pain ever   Pain Location Vagina   Pain Orientation Mid   Pain Descriptors / Indicators Shooting   Pain Type Acute pain   Pain Onset More than a month ago   Pain Frequency Intermittent   Aggravating Factors  intercourse, vaginal exam   Pain Relieving Factors no intercourse   Multiple Pain Sites No                      Pelvic Floor Special Questions - 07/02/16 0001    Pelvic Floor Internal Exam Patient confirms identification and approves therapist to assess pelvic muscle integrity   Exam Type Vaginal           [facility] Adult PT Treatment/Exercise - 07/02/16 0001      Manual Therapy   Manual Therapy Myofascial release;Internal Pelvic Floor   Myofascial Release fingers on bil. sides of perineum working through 3 planes of fascia for the urogenital diaphgram   Internal  Pelvic Floor bi manual levator ani release with one finger internally and other externally and to obturator foramen bilateral sides                PT Education - 07/02/16 0826    Education provided Yes   Education Details stretches   Person(s) Educated Patient   Methods Explanation;Demonstration;Verbal cues;Handout   Comprehension Returned demonstration;Verbalized understanding          PT Short Term Goals - 07/02/16 0851      PT SHORT TERM GOAL #1   Title independent with flexibility exericses   Time 4   Period Weeks   Status On-going  just learned them     PT SHORT TERM GOAL #2   Title purchased vaginal dilatores   Time 4   Period Weeks   Status On-going  bought them but has not received them     PT SHORT TERM GOAL #3   Title understand how to perform self perineal massage   Time 4   Period Weeks   Status New     PT SHORT TERM GOAL #4   Title understand how to relax the pelvic floor during foreplay with her spouse   Time 4   Period Weeks  Status On-going  has been educated on           PT Long Term Goals - 06/28/16 1114      PT LONG TERM GOAL #1   Title independent with HEP   Time 4   Period Months   Status New     PT LONG TERM GOAL #2   Title ability to have penile pentration due to able to relax the pelvic floor muscles and pain decreased >/= 75%   Time 4   Period Weeks   Status New     PT LONG TERM GOAL #3   Title doing pelvic floor mediation exercise daily to keep muscles relaxed   Time 4   Period Months   Status New     PT LONG TERM GOAL #4   Title During pelvic floor soft tissue work no palpable pain due to decreased tone of pelvic floor.    Time 4   Period Months   Status New     PT LONG TERM GOAL #5   Title independent with use of pelvic floor dilators to keep the vaginal introitus expanded   Time 4   Period Months   Status New               Plan - 07/02/16 0981    Clinical Impression Statement Patient has  purchased her dilators.  Patient has been doing the pelvic floor meditation and learned when her body is tense with self awarness.  Patient had less tension in the pelvic floor muscles after therapy.  Patient understands ways to stretch at work. Patient will benefit from skilled therapy to reduce pelvic floor muscle tension and reduce pain for penile penetration.    Rehab Potential Excellent   Clinical Impairments Affecting Rehab Potential None   PT Frequency 1x / week   PT Duration Other (comment)  4 months   PT Treatment/Interventions Biofeedback;Moist Heat;Ultrasound;Patient/family education;Neuromuscular re-education;Therapeutic exercise;Therapeutic activities;Manual techniques   PT Next Visit Plan relaxation techniques with foreplay, how to use dilators, self perineal massage   PT Home Exercise Plan self perineal massage, how to use dilators   Consulted and Agree with Plan of Care Patient      Patient will benefit from skilled therapeutic intervention in order to improve the following deficits and impairments:  Pain, Increased muscle spasms, Decreased endurance  Visit Diagnosis: Other muscle spasm     Problem List There are no active problems to display for this patient.   Eulis Foster, PT 07/02/16 8:56 AM   [facility] [address] [place], [place], [address] Phone: [phone]   Fax:  [phone]  Name: Jodi Mahoney MRN: [id number] Date of Birth: 1961-01-21

## 2016-07-09 ENCOUNTER — Encounter: Payer: PRIVATE HEALTH INSURANCE | Admitting: Physical Therapy

## 2016-07-23 ENCOUNTER — Encounter: Payer: Self-pay | Admitting: Physical Therapy

## 2016-07-23 ENCOUNTER — Ambulatory Visit: Payer: 59 | Attending: Obstetrics and Gynecology | Admitting: Physical Therapy

## 2016-07-23 DIAGNOSIS — M62838 Other muscle spasm: Secondary | ICD-10-CM | POA: Insufficient documentation

## 2016-07-23 NOTE — Therapy (Signed)
[facility] [address] [place], [place], [address] Phone: [phone]   Fax:  [phone]  Physical Therapy Treatment  Patient Details  Name: Jodi MatteCynthia B Mahoney MRN: [id number] Date of Birth: [birth year] Referring Provider: Dr. Marcelle OverlieMichelle Grewal  Encounter Date: 07/23/2016      PT End of Session - 07/23/16 0936    Visit Number 3   Date for PT Re-Evaluation 10/29/16   PT Start Time 0936   PT Stop Time 1015   PT Time Calculation (min) 39 min   Activity Tolerance Patient tolerated treatment well   Behavior [facility] Therapy [facility] for tasks assessed/performed      Past Medical History:  Diagnosis Date  . Thyroid disease     Past Surgical History:  Procedure Laterality Date  . TUBAL LIGATION      There were no vitals filed for this visit.      Subjective Assessment - 07/23/16 0936    Subjective I have used the smaller dilator.  Unable to progress due to having company in the house.  I am more aware of my body. I feel alot of tension in my upper traps.    Patient Stated Goals reduce pain with intercourse   Currently in Pain? Yes   Pain Score 10-Worst pain ever   Pain Location Vagina   Pain Orientation Mid   Pain Descriptors / Indicators Aching   Pain Type Acute pain   Pain Onset More than a month ago   Pain Frequency Intermittent   Aggravating Factors  intercourse, vaginal exam   Pain Relieving Factors no intercourse   Multiple Pain Sites No                         [facility] Adult PT Treatment/Exercise - 07/23/16 0001      Self-Care   Self-Care Other Self-Care Comments   Other Self-Care Comments  using a rolling bag for work, decrease weight of purse; how to use dilator and ways to increase size of dilator; different lubricants     Neuro Re-ed    Neuro Re-ed Details  diphagramatic breathing 1 min                PT Education - 07/23/16 0959    Education provided Yes   Education  Details interscapular strength with red band; upper body stretches; lubricants;  how to progress dilators   Person(s) Educated Patient   Methods Explanation;Demonstration;Verbal cues;Handout   Comprehension Returned demonstration;Verbalized understanding          PT Short Term Goals - 07/23/16 1017      PT SHORT TERM GOAL #1   Title independent with flexibility exericses   Time 4   Period Weeks   Status Achieved     PT SHORT TERM GOAL #2   Title purchased vaginal dilatores   Time 4   Period Weeks   Status Achieved     PT SHORT TERM GOAL #3   Title understand how to perform self perineal massage   Time 4   Period Weeks   Status On-going     PT SHORT TERM GOAL #4   Title understand how to relax the pelvic floor during foreplay with her spouse   Time 4   Period Weeks   Status Achieved           PT Long Term Goals - 06/28/16 1114      PT LONG TERM GOAL #1  Title independent with HEP   Time 4   Period Months   Status New     PT LONG TERM GOAL #2   Title ability to have penile pentration due to able to relax the pelvic floor muscles and pain decreased >/= 75%   Time 4   Period Weeks   Status New     PT LONG TERM GOAL #3   Title doing pelvic floor mediation exercise daily to keep muscles relaxed   Time 4   Period Months   Status New     PT LONG TERM GOAL #4   Title During pelvic floor soft tissue work no palpable pain due to decreased tone of pelvic floor.    Time 4   Period Months   Status New     PT LONG TERM GOAL #5   Title independent with use of pelvic floor dilators to keep the vaginal introitus expanded   Time 4   Period Months   Status New               Plan - 07/23/16 1015    Clinical Impression Statement Patient is using the dilator.  Patient is understanding her stresses and feeling it inher body.  Patient has not had intercourse yet.  Patient is able to use the first dilator without difficulty.  Patient will benefit from skilled  therapy to reduce pelvic floor muscle tension and reduce pain for penile penetration.    Rehab Potential Excellent   Clinical Impairments Affecting Rehab Potential None   PT Frequency 1x / week   PT Duration Other (comment)  4 months   PT Treatment/Interventions Biofeedback;Moist Heat;Ultrasound;Patient/family education;Neuromuscular re-education;Therapeutic exercise;Therapeutic activities;Manual techniques   PT Next Visit Plan using the pelvic EMG   PT Home Exercise Plan progress as needed   Consulted and Agree with Plan of Care Patient      Patient will benefit from skilled therapeutic intervention in order to improve the following deficits and impairments:  Pain, Increased muscle spasms, Decreased endurance  Visit Diagnosis: Other muscle spasm     Problem List There are no active problems to display for this patient.   Eulis FosterCheryl Brynnan Rodenbaugh, PT 07/23/16 10:18 AM   [facility] [address] [place], [place], [address] Phone: [phone]   Fax:  [phone]  Name: Jodi Mahoney MRN: [id number] Date of Birth: 1961/03/11

## 2016-07-23 NOTE — Patient Instructions (Addendum)
Levator Scapula Stretch, Sitting    Sit, one hand tucked under hip on side to be stretched, other hand over top of head. Turn head toward other side and look down. Use hand on head to gently stretch neck in that position. Hold _30__ seconds. Repeat __2_ times per session. Do 2___ sessions per day.  Copyright  VHI. All rights reserved.  Side Bend, Sitting    Sit, one hand grasping other arm above wrist. Pull down and across body while gently tilting head toward pulling side. Hold _15__ seconds. For more stretch, lean body in direction of head tilt. Repeat _2__ times per session. Do _2__ sessions per day.  Copyright  VHI. All rights reserved.  Upper Limb Neural Tension I, Standing    Stand, one hand over top of head, other hand against low back. Turn head down toward pulling side. Gently increase stretch by pulling on head and depressing opposite (stretch-side) shoulder blade. Hold _15__ seconds. Repeat _2__ times per session. Do _2__ sessions per day.  Copyright  VHI. All rights reserved.  Anterior Capsule Stretch, Standing    Stand, hands clasped behind back. Raise arms backward and upward as far as possible. Hold _15__ seconds. Open out chest. Reach arms back and down.  Repeat _2__ times per session. Do __2_ sessions per day.  Copyright  VHI. All rights reserved.    Back Hyperextension: Using Arms    Lying face down with arms bent, inhale. Then while exhaling, straighten arms. Hold __5__ seconds. Slowly return to starting position. Repeat __3__ times per set. Do _1___ sets per session. Do __1__ sessions per day.  Copyright  VHI. All rights reserved.   PNF Strengthening: Resisted   Standing with resistive band around each hand, bring right arm up and away, thumb back. Repeat _10___ times per set. Do _2___ sets per session. Do _1-2___ sessions per day.      Resisted Horizontal Abduction: Bilateral   Sit or stand, tubing in both hands, arms out in front.  Keeping arms straight, pinch shoulder blades together and stretch arms out. Repeat _10___ times per set. Do 2____ sets per session. Do _1-2___ sessions per day.   Scapular Retraction: Elbow Flexion (Standing)   With elbows bent to 90, pinch shoulder blades together and rotate arms out, keeping elbows bent. Repeat _10___ times per set. Do _1___ sets per session. Do many____ sessions per day.  [facility] [address], [address] [place], [place] [address] Phone # [phone] Fax [phone]

## 2016-07-30 ENCOUNTER — Ambulatory Visit: Payer: 59 | Admitting: Physical Therapy

## 2016-07-30 ENCOUNTER — Encounter: Payer: Self-pay | Admitting: Physical Therapy

## 2016-07-30 DIAGNOSIS — M62838 Other muscle spasm: Secondary | ICD-10-CM | POA: Diagnosis not present

## 2016-07-30 NOTE — Therapy (Signed)
[facility] [address], [place], [place], [address] Phone: [phone]   Fax:  [phone]  Physical Therapy Treatment  Patient Details  Name: Jodi Mahoney MRN: [id number] Date of Birth: 16-Jul-1961 Referring Provider: Dr. Dian Queen  Encounter Date: 07/30/2016      PT End of Session - 07/30/16 1013    Visit Number 4   Date for PT Re-Evaluation 10/29/16   PT Start Time 0930   PT Stop Time 1013   PT Time Calculation (min) 43 min   Activity Tolerance Patient tolerated treatment well   Behavior During Therapy [facility] for tasks assessed/performed      Past Medical History:  Diagnosis Date  . Thyroid disease     Past Surgical History:  Procedure Laterality Date  . TUBAL LIGATION      There were no vitals filed for this visit.      Subjective Assessment - 07/30/16 0937    Subjective I am under stress.  I am progressing slowly. Have not had intercourse. I have moved up a couple of sizes and my husband is participating.    Patient Stated Goals reduce pain with intercourse   Currently in Pain? Yes   Pain Score 10-Worst pain ever   Pain Location Vagina   Pain Orientation Mid   Pain Descriptors / Indicators Aching   Pain Type Acute pain   Pain Onset More than a month ago   Pain Frequency Intermittent   Aggravating Factors  intercourse, vaginal exam   Pain Relieving Factors no intercourse   Multiple Pain Sites No                      Pelvic Floor Special Questions - 07/30/16 0001    Biofeedback bulging of perineum while filling abdomen with air and pushing outward 8x; contract then bulge perineum to move through full excursion 10x;   Biofeedback sensor type Surface  vaginal   Biofeedback Activity --  resting tone with q-tip insertion           [facility] Adult PT Treatment/Exercise - 07/30/16 0001      Self-Care   Self-Care Other Self-Care Comments   Other Self-Care Comments   discussed dilators and how to use with her husband and her comfort level; keeping stress down in life                PT Education - 07/30/16 1009    Education provided Yes   Education Details contract and bulge perineum   Person(s) Educated Patient   Methods Explanation;Demonstration;Verbal cues;Handout   Comprehension Returned demonstration;Verbalized understanding          PT Short Term Goals - 07/30/16 0939      PT SHORT TERM GOAL #1   Title independent with flexibility exericses   Time 4   Period Weeks   Status Achieved     PT SHORT TERM GOAL #2   Title purchased vaginal dilatores   Time 4   Period Weeks   Status Achieved     PT SHORT TERM GOAL #3   Title understand how to perform self perineal massage   Time 4   Period Weeks   Status Achieved     PT SHORT TERM GOAL #4   Title understand how to relax the pelvic floor during foreplay with her spouse   Time 4   Period Weeks   Status Achieved  PT Long Term Goals - 06/28/16 1114      PT LONG TERM GOAL #1   Title independent with HEP   Time 4   Period Months   Status New     PT LONG TERM GOAL #2   Title ability to have penile pentration due to able to relax the pelvic floor muscles and pain decreased >/= 75%   Time 4   Period Weeks   Status New     PT LONG TERM GOAL #3   Title doing pelvic floor mediation exercise daily to keep muscles relaxed   Time 4   Period Months   Status New     PT LONG TERM GOAL #4   Title During pelvic floor soft tissue work no palpable pain due to decreased tone of pelvic floor.    Time 4   Period Months   Status New     PT LONG TERM GOAL #5   Title independent with use of pelvic floor dilators to keep the vaginal introitus expanded   Time 4   Period Months   Status New               Plan - 07/30/16 1013    Clinical Impression Statement Patient is on the third dilator and letting her husband use the dilator with her to be more  comfortable.  Patient is able to relax the perineum when therapist places a q-tip into the vagina. Next visit she will bring the dilator to work on pelvic floor relaxation. Patient has met her STG's.  Patient will benefit from skilled therapy to reduce pelvic floor muscle tension and reduce pain for penile penetration.    Rehab Potential Excellent   Clinical Impairments Affecting Rehab Potential None   PT Frequency 1x / week   PT Duration Other (comment)  4 months   PT Treatment/Interventions Biofeedback;Moist Heat;Ultrasound;Patient/family education;Neuromuscular re-education;Therapeutic exercise;Therapeutic activities;Manual techniques   PT Next Visit Plan using the pelvic EMG with dilator   PT Home Exercise Plan progress as needed   Consulted and Agree with Plan of Care Patient      Patient will benefit from skilled therapeutic intervention in order to improve the following deficits and impairments:  Pain, Increased muscle spasms, Decreased endurance  Visit Diagnosis: Other muscle spasm     Problem List There are no active problems to display for this patient.   Earlie Counts, PT 07/30/16 10:16 AM   [facility] [address], [place], [place], [address] Phone: [phone]   Fax:  [phone]  Name: Jodi Mahoney MRN: [id number] Date of Birth: Nov 05, 1960

## 2016-07-30 NOTE — Patient Instructions (Addendum)
Bear Down    First contract pelvic floor then bear down as if to have a bowel movement. Repeat _10__ times. Do __2_ times a day.  Copyright  VHI. All rights reserved.  [facility] [address], [address] [place], [place] [address] Phone # [phone] Fax [phone]

## 2016-08-06 ENCOUNTER — Encounter: Payer: PRIVATE HEALTH INSURANCE | Admitting: Physical Therapy

## 2016-08-13 ENCOUNTER — Encounter: Payer: Self-pay | Admitting: Physical Therapy

## 2016-08-13 ENCOUNTER — Ambulatory Visit: Payer: 59 | Admitting: Physical Therapy

## 2016-08-13 DIAGNOSIS — M62838 Other muscle spasm: Secondary | ICD-10-CM | POA: Diagnosis not present

## 2016-08-13 NOTE — Therapy (Signed)
[facility] [address], [address] [place], [place], [address] Phone: [phone]   Fax:  [phone]  Physical Therapy Treatment  Patient Details  Name: Jodi Mahoney: [id number] Date of Birth: Dec 29, 1960 Referring Provider: Dr. Marcelle OverlieMichelle Grewal  Encounter Date: 08/13/2016      PT End of Session - 08/13/16 1020    Visit Number 5   Date for PT Re-Evaluation 10/29/16   PT Start Time 1018   PT Stop Time 1059   PT Time Calculation (min) 41 min   Activity Tolerance Patient tolerated treatment well   Behavior During Therapy [facility] for tasks assessed/performed      Past Medical History:  Diagnosis Date  . Thyroid disease     Past Surgical History:  Procedure Laterality Date  . TUBAL LIGATION      There were no vitals filed for this visit.      Subjective Assessment - 08/13/16 1021    Subjective Reducing stress in life. I am on the 4th size and just started with it.  I have to use lubrication for the 4th.    Patient Stated Goals reduce pain with intercourse   Currently in Pain? Yes   Pain Score 10-Worst pain ever   Pain Location Vagina   Pain Orientation Mid   Pain Descriptors / Indicators Aching   Pain Type Acute pain   Pain Onset More than a month ago   Pain Frequency Intermittent   Aggravating Factors  intercourse, vaginal exam   Pain Relieving Factors no intercourse   Multiple Pain Sites No                      Pelvic Floor Special Questions - 08/13/16 0001    Pelvic Floor Internal Exam Patient confirms identification and approves therapist to assess pelvic muscle integrity   Exam Type Vaginal   Biofeedback Placing dilator into the vaginal canal while keeping pelvic floor relaxed 4uv or lower, used sizes #2, #3, #4 .  Therapist would move each cilator in different directions to facilitate penile penetration.    Biofeedback sensor type Surface  vaginal   Biofeedback Activity --   relaxing in hookly position                   PT Education - 08/13/16 1054    Education provided No          PT Short Term Goals - 07/30/16 0939      PT SHORT TERM GOAL #1   Title independent with flexibility exericses   Time 4   Period Weeks   Status Achieved     PT SHORT TERM GOAL #2   Title purchased vaginal dilatores   Time 4   Period Weeks   Status Achieved     PT SHORT TERM GOAL #3   Title understand how to perform self perineal massage   Time 4   Period Weeks   Status Achieved     PT SHORT TERM GOAL #4   Title understand how to relax the pelvic floor during foreplay with her spouse   Time 4   Period Weeks   Status Achieved           PT Long Term Goals - 08/13/16 1055      PT LONG TERM GOAL #1   Title independent with HEP   Time 4   Period Months   Status On-going     PT LONG TERM  GOAL #2   Title ability to have penile pentration due to able to relax the pelvic floor muscles and pain decreased >/= 75%   Time 4   Period Weeks   Status On-going  able to use dilator #4 in therapy and keep relaxed below 4uv     PT LONG TERM GOAL #3   Title doing pelvic floor mediation exercise daily to keep muscles relaxed   Time 4   Period Months   Status Achieved     PT LONG TERM GOAL #4   Title During pelvic floor soft tissue work no palpable pain due to decreased tone of pelvic floor.    Time 4   Period Months   Status On-going     PT LONG TERM GOAL #5   Title independent with use of pelvic floor dilators to keep the vaginal introitus expanded   Time 4   Period Months   Status Achieved               Plan - 08/13/16 1056    Clinical Impression Statement Patient able to relax the pelvic floor below 4 uv with dilator #2,3,4 and with movement.  Patient is performing her pelvic floor meditation while using the dilators in clinic. Patient is progressing with the dilators and is starting to understand how stress can change her tension in  the pelvic floor muscles.  Patient will benefit from skilled therapy to reduce pain fro penile penetration.    Rehab Potential Excellent   Clinical Impairments Affecting Rehab Potential None   PT Frequency 1x / week   PT Duration Other (comment)  4 months   PT Treatment/Interventions Biofeedback;Moist Heat;Ultrasound;Patient/family education;Neuromuscular re-education;Therapeutic exercise;Therapeutic activities;Manual techniques   PT Next Visit Plan soft tissue work; check on which level dilator on   PT Home Exercise Plan progress as needed   Consulted and Agree with Plan of Care Patient      Patient will benefit from skilled therapeutic intervention in order to improve the following deficits and impairments:  Pain, Increased muscle spasms, Decreased endurance  Visit Diagnosis: Other muscle spasm     Problem List There are no active problems to display for this patient.   Eulis FosterCheryl Plummer Matich, PT 08/13/16 11:02 AM   [facility] [address], [address] [place], [place], [address] Phone: [phone]   Fax:  [phone]  Name: Jodi Mahoney: [id number] Date of Birth: 1961-09-09

## 2016-08-20 ENCOUNTER — Encounter: Payer: PRIVATE HEALTH INSURANCE | Admitting: Physical Therapy

## 2016-08-27 ENCOUNTER — Encounter: Payer: PRIVATE HEALTH INSURANCE | Admitting: Physical Therapy

## 2016-09-02 ENCOUNTER — Encounter: Payer: PRIVATE HEALTH INSURANCE | Admitting: Physical Therapy

## 2016-09-03 ENCOUNTER — Encounter: Payer: PRIVATE HEALTH INSURANCE | Admitting: Physical Therapy

## 2016-09-10 ENCOUNTER — Encounter: Payer: Self-pay | Admitting: Physical Therapy

## 2016-09-10 ENCOUNTER — Ambulatory Visit: Payer: 59 | Attending: Obstetrics and Gynecology | Admitting: Physical Therapy

## 2016-09-10 DIAGNOSIS — M62838 Other muscle spasm: Secondary | ICD-10-CM | POA: Diagnosis not present

## 2016-09-10 NOTE — Therapy (Signed)
[facility] [address], [place], [place], [address] Phone: [phone]   Fax:  [phone]  Physical Therapy Treatment  Patient Details  Name: Jodi Mahoney MRN: [id number] Date of Birth: August 21, 1961 Referring Provider: Dr. Dian Queen  Encounter Date: 09/10/2016      PT End of Session - 09/10/16 0937    Visit Number 6   Date for PT Re-Evaluation 10/29/16   PT Start Time 0930   PT Stop Time 1010   PT Time Calculation (min) 40 min   Activity Tolerance Patient tolerated treatment well   Behavior During Therapy [facility] for tasks assessed/performed      Past Medical History:  Diagnosis Date  . Thyroid disease     Past Surgical History:  Procedure Laterality Date  . TUBAL LIGATION      There were no vitals filed for this visit.      Subjective Assessment - 09/10/16 0937    Subjective I am on the 5th dilator.  This is my bad time of year and very busy at work. The 5th dilator is not comfortable. No intercourse yet. I am not ready yet due to my stress factor. Have not been good with the pelvic floor mediation.    Patient Stated Goals reduce pain with intercourse   Currently in Pain? Yes   Pain Score 10-Worst pain ever   Pain Location Vagina   Pain Orientation Mid   Pain Descriptors / Indicators Aching   Pain Type Acute pain   Pain Onset More than a month ago   Pain Frequency Intermittent   Aggravating Factors  intercourse, vaginal exam   Pain Relieving Factors no intercourse   Multiple Pain Sites No                         [facility] Adult PT Treatment/Exercise - 09/10/16 0001      Balance Poses: Yoga   Warrior I --  reverse warrior pose hold 30 sec bil.    Warrior II 2 reps;30 seconds;Other (comment)  Vc cues for correct posture     Exercises   Exercises Other Exercises   Other Exercises  stand trunk flexion; half flexion      Lumbar Exercises: Stretches   Active Hamstring  Stretch 2 reps;30 seconds;Other (comment)  stting   Active Hamstring Stretch Limitations Downward dog with breathing and therapist giving tactile cues to extend mid thoracic   Passive Hamstring Stretch 1 rep;30 seconds;Other (comment)   Passive Hamstring Stretch Limitations use strap and go across body then outside for abduction   Pelvic Tilt Limitations cat/ camel 10 times with breathing   Quadruped Mid Back Stretch 30 seconds;4 reps  front, bil. sides, then hip abduction   Piriformis Stretch 2 reps;30 seconds;Other (comment)  sitting     Manual Therapy   Manual Therapy Myofascial release;Internal Pelvic Floor                PT Education - 09/10/16 0958    Education provided Yes   Education Details reviewed yoga stretches, walking program, getting back to her exercises   Person(s) Educated Patient   Methods Explanation;Demonstration   Comprehension Verbalized understanding;Returned demonstration          PT Short Term Goals - 07/30/16 0939      PT SHORT TERM GOAL #1   Title independent with flexibility exericses   Time 4   Period Weeks   Status Achieved  PT SHORT TERM GOAL #2   Title purchased vaginal dilatores   Time 4   Period Weeks   Status Achieved     PT SHORT TERM GOAL #3   Title understand how to perform self perineal massage   Time 4   Period Weeks   Status Achieved     PT SHORT TERM GOAL #4   Title understand how to relax the pelvic floor during foreplay with her spouse   Time 4   Period Weeks   Status Achieved           PT Long Term Goals - 09/10/16 6606      PT LONG TERM GOAL #1   Title independent with HEP   Time 4   Period Months   Status On-going     PT LONG TERM GOAL #2   Title ability to have penile pentration due to able to relax the pelvic floor muscles and pain decreased >/= 75%   Time 4   Period Weeks   Status On-going  no 5th dilator     PT LONG TERM GOAL #3   Title doing pelvic floor mediation exercise daily  to keep muscles relaxed   Time 4   Period Months   Status Achieved     PT LONG TERM GOAL #4   Title During pelvic floor soft tissue work no palpable pain due to decreased tone of pelvic floor.    Time 4   Period Months   Status On-going     PT LONG TERM GOAL #5   Title independent with use of pelvic floor dilators to keep the vaginal introitus expanded   Time 4   Period Months   Status Achieved               Plan - 09/10/16 1011    Clinical Impression Statement Patient has progressed to level 5 for dilators.  She did yoga  in therapy which relaxed her and challenged her.  Patient has been busy at work so she was not consistent with her home program and has not met goals due to that. Patient has not had intercourse due to not being ready menatlly yet.  Patient will benefit from skilled therapy to reduce pain for penile penetration.    Rehab Potential Excellent   Clinical Impairments Affecting Rehab Potential None   PT Frequency 1x / week   PT Duration Other (comment)  4 months   PT Treatment/Interventions Biofeedback;Moist Heat;Ultrasound;Patient/family education;Neuromuscular re-education;Therapeutic exercise;Therapeutic activities;Manual techniques   PT Next Visit Plan soft tissue work; work on yoga program for home   PT [place] progress as needed   Consulted and Agree with Plan of Care Patient      Patient will benefit from skilled therapeutic intervention in order to improve the following deficits and impairments:  Pain, Increased muscle spasms, Decreased endurance  Visit Diagnosis: Other muscle spasm     Problem List There are no active problems to display for this patient.   Earlie Counts, PT 09/10/16 10:14 AM   [facility] [address] [place], [place], [address] Phone: [phone]   Fax:  [phone]  Name: CARRISA KELLER MRN: [id number] Date of Birth: 25-Apr-1961

## 2016-09-17 ENCOUNTER — Ambulatory Visit: Payer: 59 | Attending: Obstetrics and Gynecology | Admitting: Physical Therapy

## 2016-09-17 ENCOUNTER — Encounter: Payer: Self-pay | Admitting: Physical Therapy

## 2016-09-17 DIAGNOSIS — M62838 Other muscle spasm: Secondary | ICD-10-CM | POA: Diagnosis not present

## 2016-09-17 NOTE — Patient Instructions (Addendum)
Cat / Cow Flow    Inhale, press spine toward ceiling like a Halloween cat. Keeping strength in arms and abdominals, exhale to soften spine through neutral and into cow pose. Open chest and arch back. Initiate movement between cat and cow at tailbone, one vertebrae at a time. Repeat _10___ times.  Copyright  VHI. All rights reserved.  Plank to Downward Dog (Floor)    From table, step to plank. Hold steady, keep base of skull rising, arms and abdominals strong, and pelvic tilt engaged. Hold for ___10_ breaths. Reach hips up and back into downward dog. Push through hands, lengthen spine. May bend knees to keep spine straight. Hold for __10__ breaths. Repeat ___5_ times.  Copyright  VHI. All rights reserved.  BACK: Child's Pose (Sciatica)    Sit in knee-chest position and reach arms forward. Separate knees for comfort. Hold position for _10__ breaths. Repeat __2_ times. Do _1__ times per day.  Copyright  VHI. All rights reserved.  Side Waist Stretch from Child's Pose    From child's pose, walk hands to left. Reach right hand out on diagonal. Reach hips back toward heels making a C with torso. Breathe into right side waist. Hold for __30__ breaths. Repeat ___1_ times each side.  Copyright  VHI. All rights reserved.  Adductor Stretch, Reclined (Strap, Wall)    Warm up with leg vertical. Rotate leg to side and fix foot to wall. Anchor opposite hip. Hold for __30__ breaths. Repeat __1__ times each leg.  Copyright  VHI. All rights reserved.  Hamstring Stretch, Reclined (Strap, Doorframe)    Lengthen bottom leg on floor. Extend top leg along edge of doorframe or press foot up into yoga strap. Hold for _30___ breaths. Repeat __1__ times each leg.  Copyright  VHI. All rights reserved.  Outer Hip Stretch: Reclined IT Band Stretch (Strap)    Strap around opposite foot, pull across only as far as possible with shoulders on mat. Hold for __30__ breaths. Repeat __1__ times  each leg.  Copyright  VHI. All rights reserved.  Warrior I    In wide stride, rotate back leg out 20, grounding foot, hands in prayer position in front of chest. Bend front leg 90. Reaching over head, look up. Hold for __15__ breaths. Repeat, other leg forward. BEGINNER: Support body with hands on hips.   Copyright  VHI. All rights reserved.  Warrior II    In wide stance, arms extended out, rotate right leg out 90, left leg in 20. Bend right leg 90 in line with foot. Keep left foot flat, hips square to front. Turn head right. Hold for __15__ breaths. Repeat on other side. NOTE: Keep bent knee behind line of toes.   http://yg.exer.us/16   Copyright  VHI. All rights reserved.  Back Namaste    In wide stride, rotate back leg out about 20, grounding heel. Place palms together behind back, fingers pointing up. Reach up between shoulder blades, arch upper torso back. Hold for ____ breaths. Repeat, other leg forward. BEGINNER: Hold elbows behind back.  Copyright  VHI. All rights reserved.  Back Namaste    In wide stride, rotate back leg out about 20, grounding heel. Place palms together behind back, fingers pointing up. Reach up between shoulder blades, arch upper torso back. Hold for __15__ breaths. Repeat, other leg forward. BEGINNER: Hold elbows behind back.  Copyright  VHI. All rights reserved.  Forward Bend Hand Under Foot    Feet together, bend torso and hips forward, place hands  under front of feet. Exhale and release over legs. Hold for _15___ breaths.  http://yg.exer.us/38   Copyright  VHI. All rights reserved.  Debroah BallerAstanga Sun Salutation (1 of 3)    Stand with feet together, arms by sides. Inhale, reaching up and back. Exhale, opening arms and bending forward from hips over legs. Inhale, reaching back out long, fingers touching floor in front of toes.   http://yg.exer.us/210   Copyright  VHI. All rights reserved.  [facility] [address], [address] [place], [place] [address] Phone # [phone] Fax [phone]

## 2016-09-17 NOTE — Therapy (Signed)
[facility] [address], [place], [place], [address] Phone: [phone]   Fax:  [phone]  Physical Therapy Treatment  Patient Details  Name: Jodi Mahoney MRN: [id number] Date of Birth: 1961/06/28 Referring Provider: Dr. Dian Queen  Encounter Date: 09/17/2016      PT End of Session - 09/17/16 1011    Visit Number 7   Date for PT Re-Evaluation 10/29/16   PT Start Time 0930   PT Stop Time 1011   PT Time Calculation (min) 41 min   Activity Tolerance Patient tolerated treatment well   Behavior During Therapy [facility] for tasks assessed/performed      Past Medical History:  Diagnosis Date  . Thyroid disease     Past Surgical History:  Procedure Laterality Date  . TUBAL LIGATION      There were no vitals filed for this visit.      Subjective Assessment - 09/17/16 0940    Subjective I am maintaining.  I am still on #5 dilator.  It is not as uncomfortable to use.  I am doing the pelvic floor mediation and my husband is doing it with me.    Patient Stated Goals reduce pain with intercourse   Currently in Pain? Yes   Pain Score 10-Worst pain ever   Pain Location Vagina   Pain Orientation Mid   Pain Descriptors / Indicators Aching   Pain Type Acute pain   Pain Onset More than a month ago   Pain Frequency Intermittent   Aggravating Factors  intercourse, vaginal exam   Pain Relieving Factors no intercourse   Multiple Pain Sites No            [facility] PT Assessment - 09/17/16 0001      AROM   Overall AROM Comments lumbar flexion decreased by 25% with tightness     Palpation   SI assessment  pelvis in correct alignment                             PT Education - 09/17/16 1009    Education provided Yes   Education Details yoga poses for home program   Person(s) Educated Patient   Methods Explanation;Demonstration;Handout   Comprehension Verbalized understanding;Returned  demonstration          PT Short Term Goals - 07/30/16 0939      PT SHORT TERM GOAL #1   Title independent with flexibility exericses   Time 4   Period Weeks   Status Achieved     PT SHORT TERM GOAL #2   Title purchased vaginal dilatores   Time 4   Period Weeks   Status Achieved     PT SHORT TERM GOAL #3   Title understand how to perform self perineal massage   Time 4   Period Weeks   Status Achieved     PT SHORT TERM GOAL #4   Title understand how to relax the pelvic floor during foreplay with her spouse   Time 4   Period Weeks   Status Achieved           PT Long Term Goals - 09/17/16 5790      PT LONG TERM GOAL #1   Title independent with HEP   Time 4   Period Months   Status On-going     PT LONG TERM GOAL #2   Title ability to have penile pentration due to able  to relax the pelvic floor muscles and pain decreased >/= 75%   Time 4   Period Weeks   Status On-going     PT LONG TERM GOAL #3   Title doing pelvic floor mediation exercise daily to keep muscles relaxed   Time 4   Period Months   Status Achieved     PT LONG TERM GOAL #4   Title During pelvic floor soft tissue work no palpable pain due to decreased tone of pelvic floor.    Time 4   Period Months   Status On-going     PT LONG TERM GOAL #5   Title independent with use of pelvic floor dilators to keep the vaginal introitus expanded   Time 4   Period Months   Status Achieved               Plan - 09/17/16 1012    Clinical Impression Statement Patient is on the 5th dilator with less pain.  Paitent has not met goals due to her still working on increasing the extensibility of the pelvic floor. Patient pelvis in correct alignment.  Patient continues to have restrictions in lumbar flexion.  Patient understands how to perform the yoga poses correctly. Patient will benefit from skilled therapy to reduce pain with penile penetration.    Rehab Potential Excellent   Clinical Impairments  Affecting Rehab Potential None   PT Frequency 1x / week   PT Duration Other (comment)  4 months   PT Treatment/Interventions Biofeedback;Moist Heat;Ultrasound;Patient/family education;Neuromuscular re-education;Therapeutic exercise;Therapeutic activities;Manual techniques   PT Next Visit Plan soft tissue work, inner thigh stretch   PT Home Exercise Plan progress as needed   Recommended Other Services None   Consulted and Agree with Plan of Care Patient      Patient will benefit from skilled therapeutic intervention in order to improve the following deficits and impairments:  Pain, Increased muscle spasms, Decreased endurance  Visit Diagnosis: Other muscle spasm     Problem List There are no active problems to display for this patient.   Earlie Counts, PT 09/17/16 10:18 AM   [facility] [address], [place], [place], [address] Phone: [phone]   Fax:  [phone]  Name: Jodi Mahoney MRN: [id number] Date of Birth: 1961/08/21

## 2016-10-01 ENCOUNTER — Encounter: Payer: PRIVATE HEALTH INSURANCE | Admitting: Physical Therapy

## 2016-10-15 ENCOUNTER — Ambulatory Visit: Payer: 59 | Admitting: Physical Therapy

## 2016-10-15 ENCOUNTER — Encounter: Payer: Self-pay | Admitting: Physical Therapy

## 2016-10-15 DIAGNOSIS — M62838 Other muscle spasm: Secondary | ICD-10-CM

## 2016-10-15 NOTE — Therapy (Signed)
[facility] [address] [place], [place], [address] Phone: [phone]   Fax:  [phone]  Physical Therapy Treatment  Patient Details  Name: Jodi Mahoney MRN: [id number] Date of Birth: 09/28/1960 Referring Provider: Dr. Marcelle Overlie  Encounter Date: 10/15/2016      PT End of Session - 10/15/16 1016    Visit Number 8   Date for PT Re-Evaluation 12/24/16   PT Start Time 0930   PT Stop Time 1012   PT Time Calculation (min) 42 min   Activity Tolerance Patient tolerated treatment well   Behavior During Therapy [facility] for tasks assessed/performed      Past Medical History:  Diagnosis Date  . Thyroid disease     Past Surgical History:  Procedure Laterality Date  . TUBAL LIGATION      There were no vitals filed for this visit.      Subjective Assessment - 10/15/16 0934    Subjective My mom has been in the hospital for 1 week.  I have not progressed right now due to stress and taking care of my mom. I am still on the fifth dilator. I have not has intercourse for the past month.    Patient Stated Goals reduce pain with intercourse   Currently in Pain? No/denies   Multiple Pain Sites No            [facility] PT Assessment - 10/15/16 0001      Assessment   Medical Diagnosis Vaginismus   Referring Provider Dr. Marcelle Overlie   Onset Date/Surgical Date 06/28/14   Prior Therapy None     Precautions   Precautions None     Restrictions   Weight Bearing Restrictions No     Balance Screen   Has the patient fallen in the past 6 months No   Has the patient had a decrease in activity level because of a fear of falling?  No   Is the patient reluctant to leave their home because of a fear of falling?  No     Home Tourist information centre manager residence     Prior Function   Level of Independence Independent     Cognition   Overall Cognitive Status Within Functional Limits for tasks  assessed     Observation/Other Assessments   Focus on Therapeutic Outcomes (FOTO)  17% limitation     Posture/Postural Control   Posture/Postural Control No significant limitations     AROM   Overall AROM Comments lumbar flexion decreased by 25% with tightness     Palpation   Spinal mobility L1-L5 decreased P-A mobility   SI assessment  pelvis in correct alignment                  Pelvic Floor Special Questions - 10/15/16 0001    Pelvic Floor Internal Exam Patient confirms identification and approves therapist to assess pelvic muscle integrity   Exam Type Vaginal           [facility] Adult PT Treatment/Exercise - 10/15/16 0001      Self-Care   Self-Care Other Self-Care Comments   Other Self-Care Comments  pelvic floor meditation with body scan     Knee/Hip Exercises: Stretches   Passive Hamstring Stretch Both;1 rep;60 seconds  2 times with deep breathing    Passive Hamstring Stretch Limitations therapist guided back forward   Piriformis Stretch 60 seconds;Left;Right  pigeon pose   Other Knee/Hip Stretches sitting butterfly stretch with  therapist guiding her trunk forward 1 min then bringing arms overhead 1 min each;      Manual Therapy   Manual Therapy Myofascial release   Myofascial Release release of th pelvic floor diaphgram with fingers gripping the perineum and going through the layers                PT Education - 10/15/16 1013    Education provided Yes   Education Details pelvic floor meditation sent through email; body scan to relax tension in body   Person(s) Educated Patient   Methods Explanation;Demonstration   Comprehension Verbalized understanding;Returned demonstration          PT Short Term Goals - 07/30/16 0939      PT SHORT TERM GOAL #1   Title independent with flexibility exericses   Time 4   Period Weeks   Status Achieved     PT SHORT TERM GOAL #2   Title purchased vaginal dilatores   Time 4   Period Weeks   Status  Achieved     PT SHORT TERM GOAL #3   Title understand how to perform self perineal massage   Time 4   Period Weeks   Status Achieved     PT SHORT TERM GOAL #4   Title understand how to relax the pelvic floor during foreplay with her spouse   Time 4   Period Weeks   Status Achieved           PT Long Term Goals - 10/15/16 1610      PT LONG TERM GOAL #1   Title independent with HEP   Time 4   Period Months   Status On-going     PT LONG TERM GOAL #2   Title ability to have penile pentration due to able to relax the pelvic floor muscles and pain decreased >/= 75%   Period Weeks   Status On-going  no intercourse yet     PT LONG TERM GOAL #3   Title doing pelvic floor mediation exercise daily to keep muscles relaxed   Time 4   Period Months   Status Achieved     PT LONG TERM GOAL #4   Title During pelvic floor soft tissue work no palpable pain due to decreased tone of pelvic floor.    Time 4   Period Months   Status On-going     PT LONG TERM GOAL #5   Title independent with use of pelvic floor dilators to keep the vaginal introitus expanded   Time 4   Period Months   Status Achieved               Plan - 10/15/16 1014    Clinical Impression Statement Patient has not had intercourse to see if painful due to taking care of her mom and stress. Patient is on the 5th dilator.  Patient has tightness in lower extremities and pelvic floor but understands how to meditate and do body scan to work on relaxation.  Patient still has tightness in pelvic floor muscles.  Patient will benefit from skilled therapy to reduce muscle tension and pain to improve penile penetration.    Rehab Potential Excellent   Clinical Impairments Affecting Rehab Potential None   PT Frequency 1x / week   PT Duration Other (comment)  4 months   PT Next Visit Plan soft tissue work, inner thigh stretch   PT Home Exercise Plan progress as needed   Consulted and Agree with Plan  of Care Patient       Patient will benefit from skilled therapeutic intervention in order to improve the following deficits and impairments:  Pain, Increased muscle spasms, Decreased endurance  Visit Diagnosis: Other muscle spasm - Plan: PT plan of care cert/re-cert     Problem List There are no active problems to display for this patient.   Eulis FosterCheryl Jakeem Grape, PT 10/15/16 10:22 AM   [facility] [address] [place], [place], [address] Phone: [phone]   Fax:  [phone]  Name: Jodi Mahoney MRN: [id number] Date of Birth: 12-26-1960

## 2016-10-31 ENCOUNTER — Encounter: Payer: Self-pay | Admitting: Physical Therapy

## 2016-10-31 ENCOUNTER — Ambulatory Visit: Payer: 59 | Attending: Obstetrics and Gynecology | Admitting: Physical Therapy

## 2016-10-31 DIAGNOSIS — M62838 Other muscle spasm: Secondary | ICD-10-CM

## 2016-10-31 NOTE — Therapy (Signed)
[facility] [address], [address] [place], [place], [address] Phone: [phone]   Fax:  [phone]  Physical Therapy Treatment  Patient Details  Name: Jodi Mahoney MRN: [id number] Date of Birth: May 16, 1961 Referring Provider: Dr. Marcelle OverlieMichelle Grewal  Encounter Date: 10/31/2016      PT End of Session - 10/31/16 1455    Visit Number 9   Date for PT Re-Evaluation 12/24/16   PT Start Time 1445   PT Stop Time 1525   PT Time Calculation (min) 40 min   Activity Tolerance Patient tolerated treatment well   Behavior During Therapy [facility] for tasks assessed/performed      Past Medical History:  Diagnosis Date  . Thyroid disease     Past Surgical History:  Procedure Laterality Date  . TUBAL LIGATION      There were no vitals filed for this visit.      Subjective Assessment - 10/31/16 1450    Subjective I found a new pelvic video to relax the pelvic floor for stretches.  It has been helping.  My busy time of year is over for a little bit.  I am still on level 5 dilator and have my husband manuever level 4 dilator. I am having my mona lisa this month.  I am sore with my stretches.    Patient Stated Goals reduce pain with intercourse   Currently in Pain? No/denies                      Pelvic Floor Special Questions - 10/31/16 0001    Pelvic Floor Internal Exam Patient confirms identification and approves therapist to assess pelvic muscle integrity   Exam Type Vaginal           [facility] Adult PT Treatment/Exercise - 10/31/16 0001      Self-Care   Self-Care Other Self-Care Comments   Other Self-Care Comments  discussed with patient ways to relax the pelvic floor when she is having the [employer]aMona Lisa Treatment.      Manual Therapy   Manual Therapy Myofascial release;Internal Pelvic Floor   Myofascial Release release of the urorgenital diaphgram going through 3 planes of fascial   Internal Pelvic Floor  bilateral obturator internist and puborectalis with one hand on interior and exterior of muscles                PT Education - 10/31/16 1608    Education provided Yes   Education Details ways to relax pelvic floor with Drue StagerMona Lisa procedure   Person(s) Educated Patient   Methods Explanation   Comprehension Verbalized understanding          PT Short Term Goals - 07/30/16 0939      PT SHORT TERM GOAL #1   Title independent with flexibility exericses   Time 4   Period Weeks   Status Achieved     PT SHORT TERM GOAL #2   Title purchased vaginal dilatores   Time 4   Period Weeks   Status Achieved     PT SHORT TERM GOAL #3   Title understand how to perform self perineal massage   Time 4   Period Weeks   Status Achieved     PT SHORT TERM GOAL #4   Title understand how to relax the pelvic floor during foreplay with her spouse   Time 4   Period Weeks   Status Achieved  PT Long Term Goals - 10/31/16 1454      PT LONG TERM GOAL #1   Title independent with HEP   Time 4   Period Months   Status On-going  using videos     PT LONG TERM GOAL #2   Title ability to have penile pentration due to able to relax the pelvic floor muscles and pain decreased >/= 75%   Time 4   Period Weeks   Status On-going     PT LONG TERM GOAL #3   Title doing pelvic floor mediation exercise daily to keep muscles relaxed   Time 4   Period Months   Status Achieved     PT LONG TERM GOAL #4   Title During pelvic floor soft tissue work no palpable pain due to decreased tone of pelvic floor.    Time 4   Period Months   Status On-going  level 5 dilator     PT LONG TERM GOAL #5   Title independent with use of pelvic floor dilators to keep the vaginal introitus expanded   Time 4   Period Months   Status Achieved               Plan - 10/31/16 1529    Clinical Impression Statement Patient is on the 4th dilator with her husband using it on her and when she uses it  on herself she is on dilator #5.  Patient will have Drue Stager procedure next week.  Patient had some muscle tension in left pelvic floor muscles that relaxed after soft tissue work from the therapist.  Patient is using you tube videos at home for pelvic floor stretches and is diligent with it.  Patient will benefit from skilled therapy to relax the pelvic floor muscles to further progress to dilator #6.     Rehab Potential Excellent   Clinical Impairments Affecting Rehab Potential None   PT Frequency 1x / week   PT Duration Other (comment)  4 months   PT Treatment/Interventions Biofeedback;Moist Heat;Ultrasound;Patient/family education;Neuromuscular re-education;Therapeutic exercise;Therapeutic activities;Manual techniques   PT Next Visit Plan soft tissue work,see how mona lisa procedure went   PT Home Exercise Plan progress as needed   Consulted and Agree with Plan of Care Patient      Patient will benefit from skilled therapeutic intervention in order to improve the following deficits and impairments:  Pain, Increased muscle spasms, Decreased endurance  Visit Diagnosis: Other muscle spasm     Problem List There are no active problems to display for this patient.   Eulis Foster, PT 10/31/16 4:10 PM   [facility] [address], [address] [place], [place], [address] Phone: [phone]   Fax:  [phone]  Name: Jodi Mahoney MRN: [id number] Date of Birth: 1961-07-12

## 2016-11-18 ENCOUNTER — Encounter: Payer: 59 | Admitting: Physical Therapy

## 2016-11-20 ENCOUNTER — Ambulatory Visit: Payer: 59 | Attending: Obstetrics and Gynecology | Admitting: Physical Therapy

## 2016-11-20 ENCOUNTER — Encounter: Payer: Self-pay | Admitting: Physical Therapy

## 2016-11-20 DIAGNOSIS — M62838 Other muscle spasm: Secondary | ICD-10-CM | POA: Insufficient documentation

## 2016-11-20 NOTE — Patient Instructions (Signed)
[employer] Relveum Skin Repair Cream (4 oz) 4% Lidocaine 96% natural ingredients, nerve pain relief, shingles, radiation burns, vulvodynia,... [employer]  Use the #5 for 2 min then insert the #6 for 5 min. Then go back to #5 and move the device in different directions. No more than 3/10 pain.   Keep up with your previous exercises.   Legs Up The Wall: Pelvic Floor + Leg Movements Timed With Breath  By Helane Rimashelly Prosko  Pelvic Floor Physio Yoga Exercises by shelly prosko Breath Awareness Meditation 3 minutes  By [facility] Prosko  [facility] [address], [address] [place], [place] [address] Phone # [phone] Fax [phone]

## 2016-11-20 NOTE — Therapy (Addendum)
[facility] [address] [place], [place], [address] Phone: [phone]   Fax:  [phone]  Physical Therapy Treatment  Patient Details  Name: Jodi Mahoney MRN: [id number] Date of Birth: 10/29/1960 Referring Provider: Dr. Dian Queen  Encounter Date: 11/20/2016      PT End of Session - 11/20/16 1139    Visit Number 10   Date for PT Re-Evaluation 12/24/16   PT Start Time 1100   PT Stop Time 1138   PT Time Calculation (min) 38 min   Activity Tolerance Patient tolerated treatment well   Behavior During Therapy [facility] for tasks assessed/performed      Past Medical History:  Diagnosis Date  . Thyroid disease     Past Surgical History:  Procedure Laterality Date  . TUBAL LIGATION      There were no vitals filed for this visit.      Subjective Assessment - 11/20/16 1107    Subjective I am on thd fith dilator.  I had a Josph Macho treatment last week.  I had intercourse 1 time.  Intercourse was slow and not painful. I felt a little tension.  I was a little tight. I felt pressure.    Patient Stated Goals reduce pain with intercourse   Currently in Pain? No/denies   Multiple Pain Sites No                         [facility] Adult PT Treatment/Exercise - 11/20/16 0001      Self-Care   Self-Care Other Self-Care Comments   Other Self-Care Comments  dicussed ways to use lubricant, ways to have intercourse more often, stressors in life,how to deal with it and relax the pelvic floor                 PT Education - 11/20/16 1137    Education provided Yes   Education Details how to progress to last dilator, yoga on you tube, breath awareness, dicussed how she is dealing with stressors that will affect the pelvic floor, using a lubricant with intercourse   Person(s) Educated Patient   Methods Explanation;Handout   Comprehension Returned demonstration;Verbalized understanding           PT Short Term Goals - 07/30/16 0939      PT SHORT TERM GOAL #1   Title independent with flexibility exericses   Time 4   Period Weeks   Status Achieved     PT SHORT TERM GOAL #2   Title purchased vaginal dilatores   Time 4   Period Weeks   Status Achieved     PT SHORT TERM GOAL #3   Title understand how to perform self perineal massage   Time 4   Period Weeks   Status Achieved     PT SHORT TERM GOAL #4   Title understand how to relax the pelvic floor during foreplay with her spouse   Time 4   Period Weeks   Status Achieved           PT Long Term Goals - 11/20/16 1142      PT LONG TERM GOAL #1   Title independent with HEP   Time 4   Period Months   Status On-going     PT LONG TERM GOAL #2   Title ability to have penile pentration due to able to relax the pelvic floor muscles and pain decreased >/= 75%   Time 4  Period Weeks   Status Achieved     PT LONG TERM GOAL #3   Title doing pelvic floor mediation exercise daily to keep muscles relaxed   Time 4   Period Months   Status Achieved     PT LONG TERM GOAL #4   Title During pelvic floor soft tissue work no palpable pain due to decreased tone of pelvic floor.    Time 4   Period Months   Status Achieved     PT LONG TERM GOAL #5   Title independent with use of pelvic floor dilators to keep the vaginal introitus expanded   Time 4   Period Months   Status Achieved               Plan - 11/20/16 1139    Clinical Impression Statement Patient able to have intercourse without pain just tightness. Patient had Josph Macho treatment last week and it was more comfortable. Patient is seeing someone to discuss her stress with her mother and how to deal with it. Patient has used the 5th dilator and therapist discussed with patient on using the 6th.  Patient will benefit from skilled therapy to relax the pelvic floor muscles to further progress to dilator #6.    Rehab Potential Excellent   Clinical Impairments  Affecting Rehab Potential None   PT Frequency 1x / week   PT Duration Other (comment)  4 months   PT Treatment/Interventions Biofeedback;Moist Heat;Ultrasound;Patient/family education;Neuromuscular re-education;Therapeutic exercise;Therapeutic activities;Manual techniques   PT Next Visit Plan Discharge next visit   PT Home Exercise Plan progress as needed   Consulted and Agree with Plan of Care Patient      Patient will benefit from skilled therapeutic intervention in order to improve the following deficits and impairments:  Pain, Increased muscle spasms, Decreased endurance  Visit Diagnosis: Other muscle spasm     Problem List There are no active problems to display for this patient.   Earlie Counts, PT 11/20/16 11:43 AM   [facility] [address], [place], [place], [address] Phone: [phone]   Fax:  [phone]  Name: Jodi Mahoney MRN: [id number] Date of Birth: 09-Jul-1961  PHYSICAL THERAPY DISCHARGE SUMMARY  Visits from Start of Care: 10  Current functional level related to goals / functional outcomes: See above.   Remaining deficits: See above   Education / Equipment: HEP Plan: Patient agrees to discharge.  Patient goals were met. Patient is being discharged due to meeting the stated rehab goals.  Thank you for the referral. Earlie Counts, PT 02/20/17 12:45 PM  ?????

## 2016-12-24 ENCOUNTER — Encounter: Payer: 59 | Admitting: Physical Therapy

## 2017-01-02 ENCOUNTER — Encounter: Payer: 59 | Admitting: Physical Therapy

## 2020-12-08 ENCOUNTER — Telehealth: Payer: Self-pay | Admitting: Gastroenterology

## 2020-12-08 NOTE — Telephone Encounter (Signed)
No problem at all!

## 2020-12-08 NOTE — Telephone Encounter (Signed)
Hi Dr. Orvan Falconer,   Patient called wanting to schedule for her recall colonoscopy that has been due since 12/2019. She is former patient of Dr. Juanda Chance and is requesting to transfer her care over to you. She is currently assigned to Dr. Barron Alvine.     Please advise on scheduling. Thank you

## 2020-12-08 NOTE — Telephone Encounter (Signed)
OK with me.

## 2020-12-08 NOTE — Telephone Encounter (Signed)
Hi Dr. Barron Alvine,  Would you be ok with the transfer of care request?

## 2020-12-11 ENCOUNTER — Encounter: Payer: Self-pay | Admitting: Gastroenterology

## 2021-02-16 ENCOUNTER — Other Ambulatory Visit: Payer: Self-pay

## 2021-02-16 ENCOUNTER — Ambulatory Visit (AMBULATORY_SURGERY_CENTER): Payer: PRIVATE HEALTH INSURANCE

## 2021-02-16 VITALS — Ht 65.0 in | Wt 160.0 lb

## 2021-02-16 DIAGNOSIS — Z800 Family history of malignant neoplasm of digestive organs: Secondary | ICD-10-CM

## 2021-02-16 MED ORDER — PEG-KCL-NACL-NASULF-NA ASC-C 100 G PO SOLR: 1.0000 | Freq: Once | ORAL | 0 refills | Status: AC

## 2021-02-16 NOTE — Progress Notes (Signed)
Pt verified name, DOB, address and insurance during PV today.   Pt mailed instruction packet to include  copy of consent form to read and not return, and instructions.. PV completed over the phone. Pt encouraged to call with questions or issues.   No allergies to soy or egg Pt is not on blood thinners or diet pills Denies issues with sedation/intubation Denies atrial flutter/fib Denies constipation   Pt is aware of Covid safety and care partner requirements.  Pt discussed that the prep last time made her vomit.  [facility] nurse offered to get a nausea med for her and discussed ways to make the prep more palatable-such as using a sucker or hard candy between drinks, to slow down speed of drinking as well as straw use.  Pt declined medication and said she would try the suggestions.  Pt was encouraged to call if she changed her mind about med or if she had issues.  The after hrs phone number was emphasized to her.

## 2021-03-02 ENCOUNTER — Encounter: Payer: 59 | Admitting: Gastroenterology

## 2021-04-25 ENCOUNTER — Telehealth: Payer: Self-pay | Admitting: Gastroenterology

## 2021-04-25 DIAGNOSIS — Z800 Family history of malignant neoplasm of digestive organs: Secondary | ICD-10-CM

## 2021-04-25 MED ORDER — ONDANSETRON HCL 4 MG PO TABS: 4.0000 mg | ORAL_TABLET | ORAL | 0 refills | Status: AC

## 2021-04-25 MED ORDER — CLENPIQ 10-3.5-12 MG-GM -GM/160ML PO SOLN: 1.0000 | ORAL | 0 refills | Status: DC

## 2021-04-25 NOTE — Telephone Encounter (Signed)
Pt had Movi prep 2016 and made her deathly sick- super nauseated- she was given movi prep at her PV In June- pt is concerned about nausea- she is asking for  lower volume prep- she said she is having anxiety about prep    Sent in Clenpiq prep Zofran for nausea   Pt will pick up prep instructions 3rd floor Thursday 8-11  She will call if has further issues

## 2021-04-27 ENCOUNTER — Other Ambulatory Visit: Payer: Self-pay | Admitting: Gastroenterology

## 2021-04-27 DIAGNOSIS — Z800 Family history of malignant neoplasm of digestive organs: Secondary | ICD-10-CM

## 2021-05-02 ENCOUNTER — Other Ambulatory Visit: Payer: Self-pay

## 2021-05-02 ENCOUNTER — Ambulatory Visit (AMBULATORY_SURGERY_CENTER): Payer: PRIVATE HEALTH INSURANCE | Admitting: Gastroenterology

## 2021-05-02 ENCOUNTER — Encounter: Payer: Self-pay | Admitting: Gastroenterology

## 2021-05-02 VITALS — BP 105/63 | HR 60 | Temp 97.6°F | Resp 10 | Ht 65.0 in | Wt 160.0 lb

## 2021-05-02 DIAGNOSIS — Z800 Family history of malignant neoplasm of digestive organs: Secondary | ICD-10-CM | POA: Diagnosis not present

## 2021-05-02 DIAGNOSIS — Z86010 Personal history of colonic polyps: Secondary | ICD-10-CM | POA: Diagnosis not present

## 2021-05-02 MED ORDER — SODIUM CHLORIDE 0.9 % IV SOLN: 500.0000 mL | Freq: Once | INTRAVENOUS | Status: AC

## 2021-05-02 NOTE — Progress Notes (Signed)
   Referring Provider: Creola Corn, MD Primary Care Physician:  Creola Corn, MD  Reason for Consultation:  Family history of colon cancer   IMPRESSION:  Family history of colon cancer   PLAN: Colonoscopy   HPI: Jodi Mahoney is a 60 y.o. female referred for colonoscopy for a family history.   Referral records from [facility] showing normal CBC and CMP 11/07/2020.  Screening colonoscopy recommended.  Colonoscopy 2006: hyperplastic polyp Colonoscopy 2011: 2 polyps - polypoid mucosa and lymphoid aggregates without adenoma, diverticulosis Colonoscopy 2016: sigmoid diverticulosis  Father with colon cancer. Mother with colon polyps. No known family history of colon cancer or polyps. No family history of uterine/endometrial cancer, pancreatic cancer or gastric/stomach cancer.   Past Medical History:  Diagnosis Date   Adult acne    Anxiety    white coat   Thyroid disease     Past Surgical History:  Procedure Laterality Date   COLONOSCOPY  2016   TUBAL LIGATION      Current Outpatient Medications  Medication Sig Dispense Refill   levothyroxine (SYNTHROID, LEVOTHROID) 100 MCG tablet Take 112 mcg by mouth daily.      ondansetron (ZOFRAN) 4 MG tablet Take 1 tablet (4 mg total) by mouth as directed. 2 tablet 0   tretinoin (RETIN-A) 0.05 % cream tretinoin 0.05 % topical cream     ALPRAZolam (XANAX) 0.25 MG tablet alprazolam 0.25 mg tablet  TAKE 1 TABLET BY MOUTH EVERY DAY AS NEEDED     mupirocin ointment (BACTROBAN) 2 %  (Patient not taking: No sig reported)  0   Current Facility-Administered Medications  Medication Dose Route Frequency Provider Last Rate Last Admin   0.9 %  sodium chloride infusion  500 mL Intravenous Once Tressia Danas, MD        Allergies as of 05/02/2021   (No Known Allergies)    Family History  Problem Relation Age of Onset   Colon cancer Father    Colon polyps Mother    Esophageal cancer Neg Hx    Rectal cancer Neg Hx     Stomach cancer Neg Hx       Physical Exam: General:   Alert,  well-nourished, pleasant and cooperative in NAD Head:  Normocephalic and atraumatic. Eyes:  Sclera clear, no icterus.   Conjunctiva pink. Ears:  Normal auditory acuity. Nose:  No deformity, discharge,  or lesions. Mouth:  No deformity or lesions.   Neck:  Supple; no masses or thyromegaly. Lungs:  Clear throughout to auscultation.   No wheezes. Heart:  Regular rate and rhythm; no murmurs. Abdomen:  Soft, nontender, nondistended, normal bowel sounds, no rebound or guarding. No hepatosplenomegaly.   Rectal:  Deferred  Msk:  Symmetrical. No boney deformities LAD: No inguinal or umbilical LAD Extremities:  No clubbing or edema. Neurologic:  Alert and  oriented x4;  grossly nonfocal Skin:  Intact without significant lesions or rashes. Psych:  Alert and cooperative. Normal mood and affect.      Jodi Welz L. Orvan Falconer, MD, MPH 05/02/2021, 10:53 AM

## 2021-05-02 NOTE — Op Note (Signed)
[facility] Patient Name: Jodi Mahoney Procedure Date: 05/02/2021 10:51 AM MRN: [id number] Endoscopist: Tressia Danas MD, MD Age: 60 Referring MD:  Date of Birth: [birth year] Gender: Female Account #: [id number] Procedure:                Colonoscopy Indications:              Family history of colon cancer in multiple                            first-degree relatives (father)                           Prior colonoscopy 2006 (hyperplastic polyp), 2011                            (polypoid mucosa and lymphoid aggregate), 2016 (no                            polyps) Medicines:                Monitored Anesthesia Care Procedure:                Pre-Anesthesia Assessment:                           - Prior to the procedure, a History and Physical                            was performed, and patient medications and                            allergies were reviewed. The patient's tolerance of                            previous anesthesia was also reviewed. The risks                            and benefits of the procedure and the sedation                            options and risks were discussed with the patient.                            All questions were answered, and informed consent                            was obtained. Prior Anticoagulants: The patient has                            taken no previous anticoagulant or antiplatelet                            agents. ASA Grade Assessment: II - A patient with  mild systemic disease. After reviewing the risks                            and benefits, the patient was deemed in                            satisfactory condition to undergo the procedure.                           After obtaining informed consent, the colonoscope                            was passed under direct vision. Throughout the                            procedure, the patient's blood pressure, pulse, and                             oxygen saturations were monitored continuously. The                            Olympus CF-HQ190L (Serial# [id number]) Colonoscope was                            introduced through the anus and advanced to the 3                            cm into the ileum. A second forward view of the                            right colon was performed. The colonoscopy was                            performed without difficulty. The patient tolerated                            the procedure well. The quality of the bowel                            preparation was good. Scope In: 11:06:14 AM Scope Out: 11:20:41 AM Scope Withdrawal Time: 0 hours 11 minutes 1 second  Total Procedure Duration: 0 hours 14 minutes 27 seconds  Findings:                 The perianal and digital rectal examinations were                            normal except for hemorrhoids.                           Non-bleeding internal hemorrhoids were found.                           Multiple small and large-mouthed diverticula were  found in the entire colon.                           The exam was otherwise without abnormality on                            direct and retroflexion views. Complications:            No immediate complications. Estimated Blood Loss:     Estimated blood loss: none. Impression:               - Hemorrhoids.                           - Diverticulosis in the entire examined colon. Recommendation:           - Patient has a contact number available for                            emergencies. The signs and symptoms of potential                            delayed complications were discussed with the                            patient. Return to normal activities tomorrow.                            Written discharge instructions were provided to the                            patient.                           - Resume previous diet. High fiber diet recommended.                           -  Continue present medications.                           - Repeat colonoscopy in 5 years for surveillance                            given the family history.                           - Emerging evidence supports eating a diet of                            fruits, vegetables, grains, calcium, and yogurt                            while reducing red meat and alcohol may reduce the                            risk of colon cancer.                           -  Thank you for allowing me to be involved in your                            colon cancer prevention. Tressia DanasKimberly Talita Recht MD, MD 05/02/2021 11:28:53 AM This report has been signed electronically.

## 2021-05-02 NOTE — Progress Notes (Deleted)
Called to room to assist during endoscopic procedure.  Patient ID and intended procedure confirmed with present staff. Received instructions for my participation in the procedure from the performing physician.  

## 2021-05-02 NOTE — Patient Instructions (Signed)
YOU HAD AN ENDOSCOPIC PROCEDURE TODAY AT THE [facility]:   Refer to the procedure report that was given to you for any specific questions about what was found during the examination.  If the procedure report does not answer your questions, please call your gastroenterologist to clarify.  If you requested that your care partner not be given the details of your procedure findings, then the procedure report has been included in a sealed envelope for you to review at your convenience later.  YOU SHOULD EXPECT: Some feelings of bloating in the abdomen. Passage of more gas than usual.  Walking can help get rid of the air that was put into your GI tract during the procedure and reduce the bloating. If you had a lower endoscopy (such as a colonoscopy or flexible sigmoidoscopy) you may notice spotting of blood in your stool or on the toilet paper. If you underwent a bowel prep for your procedure, you may not have a normal bowel movement for a few days.  Please Note:  You might notice some irritation and congestion in your nose or some drainage.  This is from the oxygen used during your procedure.  There is no need for concern and it should clear up in a day or so.  SYMPTOMS TO REPORT IMMEDIATELY:   Following lower endoscopy (colonoscopy or flexible sigmoidoscopy):  Excessive amounts of blood in the stool  Significant tenderness or worsening of abdominal pains  Swelling of the abdomen that is new, acute  Fever of 100F or higher   For urgent or emergent issues, a gastroenterologist can be reached at any hour by calling [phone]. Do not use MyChart messaging for urgent concerns.    DIET:  We do recommend a small meal at first, but then you may proceed to your regular diet.  Drink plenty of fluids but you should avoid alcoholic beverages for 24 hours. Follow a High Fiber Diet.  MEDICATIONS: Continue present medications.  Please see handouts given to you by your recovery  nurse.  Thank you for allowing us to provide for your healthcare needs today.  ACTIVITY:  You should plan to take it easy for the rest of today and you should NOT DRIVE or use heavy machinery until tomorrow (because of the sedation medicines used during the test).    FOLLOW UP: Our staff will call the number listed on your records 48-72 hours following your procedure to check on you and address any questions or concerns that you may have regarding the information given to you following your procedure. If we do not reach you, we will leave a message.  We will attempt to reach you two times.  During this call, we will ask if you have developed any symptoms of COVID 19. If you develop any symptoms (ie: fever, flu-like symptoms, shortness of breath, cough etc.) before then, please call [phone].  If you test positive for Covid 19 in the 2 weeks post procedure, please call and report this information to us.    If any biopsies were taken you will be contacted by phone or by letter within the next 1-3 weeks.  Please call us at [phone] if you have not heard about the biopsies in 3 weeks.    SIGNATURES/CONFIDENTIALITY: You and/or your care partner have signed paperwork which will be entered into your electronic medical record.  These signatures attest to the fact that that the information above on your After Visit Summary has been reviewed and is understood.    Full responsibility of the confidentiality of this discharge information lies with you and/or your care-partner. 

## 2021-05-02 NOTE — Progress Notes (Signed)
Pt's states no medical or surgical changes since previsit or office visit.   VS taken by CW 

## 2021-05-04 ENCOUNTER — Telehealth: Payer: Self-pay

## 2021-05-04 ENCOUNTER — Telehealth: Payer: Self-pay | Admitting: *Deleted

## 2021-05-04 NOTE — Telephone Encounter (Signed)
First attempt follow up call to pt, lm for pt to call if having any problems or questions, otherwise we will call them back later this morning or early this afternoon.  °

## 2021-05-04 NOTE — Telephone Encounter (Signed)
  Follow up Call-  Call back number 05/02/2021  Post procedure Call Back phone  # [phone]  Permission to leave phone message Yes  Some recent data might be hidden     Patient questions:  Do you have a fever, pain , or abdominal swelling? No. Pain Score  0 *  Have you tolerated food without any problems? Yes.    Have you been able to return to your normal activities? Yes.    Do you have any questions about your discharge instructions: Diet   No. Medications  No. Follow up visit  No.  Do you have questions or concerns about your Care? No.  Actions: * If pain score is 4 or above: No action needed, pain <4.

## 2024-01-19 ENCOUNTER — Ambulatory Visit: Payer: PRIVATE HEALTH INSURANCE | Admitting: Dietician

## 2024-01-26 ENCOUNTER — Encounter: Payer: PRIVATE HEALTH INSURANCE | Attending: Internal Medicine | Admitting: Dietician

## 2024-01-26 ENCOUNTER — Encounter: Payer: Self-pay | Admitting: Dietician

## 2024-01-26 DIAGNOSIS — E663 Overweight: Secondary | ICD-10-CM | POA: Diagnosis present

## 2024-01-26 DIAGNOSIS — Z713 Dietary counseling and surveillance: Secondary | ICD-10-CM | POA: Diagnosis not present

## 2024-01-26 NOTE — Progress Notes (Signed)
 Medical Nutrition Therapy  Appointment Start time:  [phone]  Appointment End time:  1032  Primary concerns today: weight creeping up  Referral diagnosis: E66.3 Overweight Preferred learning style: no preference indicated (auditory, visual, hands on, no preference indicated) Learning readiness: preparation (not ready, contemplating, ready, change in progress)  NUTRITION ASSESSMENT  Anthropometrics Weight: pt reports 167 lbs  Clinical Medical Hx:  Medications: synthroid Labs: LDL/HDL ratio 1.6; triglycerides 77 Notable Signs/Symptoms: none noted  Lifestyle & Dietary Hx Pt states she is taking thyroid medications.  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 Pt is feeling more tired at this stage in her life. Pt states she ends work anytime from 5 pm to 8 pm. Pt states when she goes to the grocery store, she is uninspired. Pt states she grew up with fresh vegetables.  Estimated daily fluid intake: 48-64 oz Supplements: n/a Sleep: husband snores; and has a house guest; when alone sleeps well Stress / self-care: 5 on a scale of 1-10; counseling; stress techniques learned when caring for a parent. Current average weekly physical activity: walking on the weekend; used to walk in the evenings with husband. Pt states   24-Hr Dietary Recall First Meal: water and cereal with milk (1%) Snack:  Second Meal: banana or apple or half a peanut butter sandwich on whole wheat bread Snack:  Third Meal: omelet with cheese and mushrooms or salad or greek restaurant (steak and cheese or chicken and salad) or rotisserie chicken and mashed potatoes, squash, or [place] food/restaurant Snack: sometimes (popcorners chips, carrots, lance crackers, grapes, humus, chips) Beverages: water,   Once alcoholic drink per week  NUTRITION DIAGNOSIS  NB-1.1 Food and nutrition-related knowledge deficit As related to recent weight gain.  As  evidenced by food and activity recall.  NUTRITION INTERVENTION  Nutrition education (E-1) on the following topics:  Why you need complex carbohydrates: Whole grains and other complex carbohydrates are required to have a healthy diet. Whole grains provide fiber which can help with blood glucose levels and help keep you satiated. Fruits and starchy vegetables provide essential vitamins and minerals required for immune function, eyesight support, brain support, bone density, wound healing and many other functions within the body. According to the current evidenced based 2020-2025 Dietary Guidelines for Americans, complex carbohydrates are part of a healthy eating pattern which is associated with a decreased risk for type 2 diabetes, cancers, and cardiovascular disease.  Limiting saturated fat intake and replacing it with unsaturated fats as part of a heart-healthy diet. The type of fat you eat has a more significant impact on your health than the total amount of fat. Focusing on a dietary pattern rich in fruits, vegetables, whole grains, and healthy fats like unsaturated fats is key for overall well-being. Eating small, frequent meals with a protein source at each instance offers significant benefits, particularly relevant after bariatric surgery and for overall healthy eating: Stabilized Blood Sugar Levels: Frequent small meals help prevent large fluctuations in blood sugar, which can lead to energy crashes and cravings. Protein further aids this by slowing down glucose absorption.    Improved Energy Levels: Consistent fuel intake throughout the day helps maintain steady energy levels, preventing the highs and lows associated with larger, less frequent meals. Protein contributes to sustained energy release.    Better Nutrient Absorption: Smaller, more frequent meals can improve the body's ability to absorb nutrients efficiently. Reduced Cravings: Maintaining stable blood sugar  and consistent fullness can  help reduce cravings for unhealthy snacks and sugary foods. Protein is particularly effective at promoting satiety.    Boosted Metabolism: Some studies suggest that eating more frequently can slightly increase metabolism as the body is constantly working to digest food. Protein has a higher thermic effect of food compared to carbs and fats, meaning your body burns more calories digesting it.    Improved Mood and Focus: Stable blood sugar and consistent energy supply can positively impact mood and concentration levels throughout the day.  Handouts Provided Include  Types of Fat (saturated vs unsaturated) Meal Ideas Using the Plate Method for  a Balanced Meal Plan ([employer])  Learning Style & Readiness for Change Teaching method utilized: Visual & Auditory  Demonstrated degree of understanding via: Teach Back  Barriers to learning/adherence to lifestyle change: nothing identified  Goals Established by Pt Stop during the work day for lunch; use the plate method for balanced meal; meal plan and prep. Find an alternate breakfast/ habit in the morning (different cereal, eggs, etc...)  MONITORING & EVALUATION Dietary intake, weekly physical activity.  Next Steps  Patient is to return in 2 months for follow-up.

## 2024-03-30 ENCOUNTER — Encounter: Payer: PRIVATE HEALTH INSURANCE | Attending: Internal Medicine | Admitting: Dietician

## 2024-03-30 ENCOUNTER — Encounter: Payer: Self-pay | Admitting: Dietician

## 2024-03-30 VITALS — Ht 65.0 in | Wt 162.6 lb

## 2024-03-30 DIAGNOSIS — E663 Overweight: Secondary | ICD-10-CM | POA: Insufficient documentation

## 2024-03-30 NOTE — Progress Notes (Signed)
 Medical Nutrition Therapy Follow-up  Appointment Start time:  1011  Appointment End time:  1100  Primary concerns today: weight creeping up  Referral diagnosis: E66.3 Overweight Preferred learning style: no preference indicated (auditory, visual, hands on, no preference indicated) Learning readiness: preparation (not ready, contemplating, ready, change in progress)  NUTRITION ASSESSMENT  Anthropometrics Start Weight: pt reports 167 lbs Weight today: 162.6 lbs (pt states she lost about 7 pounds since last visit)  Body Composition Scale 03/30/2024  Current Body Weight 162.6  Total Body Fat % 34.1  Visceral Fat 8  Fat-Free Mass % 65.8   Total Body Water % 47.4  Muscle-Mass lbs 29.5  BMI 26.7  Body Fat Displacement          Torso  lbs 34.2         Left Leg  lbs 6.8         Right Leg  lbs 6.8         Left Arm  lbs 3.4         Right Arm  lbs 3.4   Clinical Medical Hx:  Medications: synthroid Labs: LDL/HDL ratio 1.6; triglycerides 77 Notable Signs/Symptoms: none noted  Lifestyle & Dietary Hx  Pt states she had a dental surgery, stating she was able to reset some of her food patterns. Pt states she is trying to eat lunch. Pt states she is working on increasing physical activity.  Estimated daily fluid intake: 64 oz Supplements: n/a Sleep: husband snores; and has a house guest; when alone sleeps well Stress / self-care: 5 on a scale of 1-10; counseling; stress techniques learned when caring for a parent. Current average weekly physical activity: walking on the weekend; used to walk in the evenings with husband.  24-Hr Dietary Recall First Meal: scrambled eggs with avocado or low sugar yogurt with banana; focusing on protein Snack:  Second Meal: banana or apple or half a peanut butter sandwich on whole wheat bread or cottage cheese and fruit. Snack:  Third Meal: omelet with cheese and mushrooms or salad or greek restaurant (steak and cheese or chicken and salad) or rotisserie  chicken and mashed potatoes, squash, or [place] food/restaurant Snack: sometimes (popcorners chips, carrots, lance crackers, grapes, humus, chips) Beverages: water,   Once alcoholic drink per week  NUTRITION DIAGNOSIS  NB-1.1 Food and nutrition-related knowledge deficit As related to recent weight gain.  As evidenced by food and activity recall.  NUTRITION INTERVENTION  Nutrition education (E-1) on the following topics:  Physical activity is essential for maintaining overall health and well-being. It improves cardiovascular health, supports mental health, enhances energy levels, and helps manage weight. Among the various forms of exercise, resistance training (such as light weights, resistance bands, or bodyweight exercises) plays a crucial role in: Building and maintaining muscle mass Improving bone density, reducing the risk of osteoporosis Enhancing metabolic rate, aiding in weight management Supporting functional strength for daily activities and injury prevention Improving insulin sensitivity and reducing the risk of chronic diseases like type 2 diabetes Incorporating both aerobic and resistance exercises into a regular routine provides the most comprehensive health benefits.  Handouts Provided Include  Health Benefits of Physical Activity Body Comp Scale print out  Goals Established by Pt Continue: Stop during the work day for lunch; use the plate method for balanced meal; meal plan and prep. Continue: Find an alternate breakfast/ habit in the morning (different cereal, eggs, etc...) New: Increase physical activity; walk outside or on a treadmill at home daily; add resistance training  2-3 days per week.  Learning Style & Readiness for Change Teaching method utilized: Visual & Auditory  Demonstrated degree of understanding via: Teach Back  Barriers to learning/adherence to lifestyle change: nothing identified  MONITORING & EVALUATION Dietary intake, weekly physical  activity.  Next Steps  Patient is to return in 2-3 months for follow-up. Pt states she will call to schedule
